# Patient Record
Sex: Female | Born: 1965 | Race: White | Hispanic: No | Smoking: Current every day smoker
Health system: Southern US, Community
[De-identification: ages and names within clinical notes are randomized; demographics above are authoritative.]

## PROBLEM LIST (undated history)

## (undated) DIAGNOSIS — G8929 Other chronic pain: Secondary | ICD-10-CM

## (undated) DIAGNOSIS — W3400XA Accidental discharge from unspecified firearms or gun, initial encounter: Secondary | ICD-10-CM

## (undated) DIAGNOSIS — F4310 Post-traumatic stress disorder, unspecified: Secondary | ICD-10-CM

## (undated) DIAGNOSIS — M5430 Sciatica, unspecified side: Secondary | ICD-10-CM

## (undated) DIAGNOSIS — Y249XXA Unspecified firearm discharge, undetermined intent, initial encounter: Secondary | ICD-10-CM

## (undated) DIAGNOSIS — F329 Major depressive disorder, single episode, unspecified: Secondary | ICD-10-CM

## (undated) DIAGNOSIS — F419 Anxiety disorder, unspecified: Secondary | ICD-10-CM

## (undated) DIAGNOSIS — E039 Hypothyroidism, unspecified: Secondary | ICD-10-CM

## (undated) DIAGNOSIS — F32A Depression, unspecified: Secondary | ICD-10-CM

## (undated) DIAGNOSIS — M549 Dorsalgia, unspecified: Secondary | ICD-10-CM

## (undated) HISTORY — DX: Hypothyroidism, unspecified: E03.9

## (undated) HISTORY — PX: EYE SURGERY: SHX253

## (undated) HISTORY — PX: ABDOMINAL HYSTERECTOMY: SHX81

---

## 2001-03-26 ENCOUNTER — Ambulatory Visit (HOSPITAL_COMMUNITY): Admission: RE | Admit: 2001-03-26 | Discharge: 2001-03-26 | Payer: Self-pay | Admitting: Internal Medicine

## 2001-06-29 ENCOUNTER — Ambulatory Visit (HOSPITAL_COMMUNITY): Admission: RE | Admit: 2001-06-29 | Discharge: 2001-06-29 | Payer: Self-pay | Admitting: Internal Medicine

## 2002-09-09 ENCOUNTER — Ambulatory Visit (HOSPITAL_COMMUNITY): Admission: RE | Admit: 2002-09-09 | Discharge: 2002-09-09 | Payer: Self-pay | Admitting: Internal Medicine

## 2002-09-09 ENCOUNTER — Encounter: Payer: Self-pay | Admitting: Internal Medicine

## 2003-08-08 ENCOUNTER — Encounter: Payer: Self-pay | Admitting: Family Medicine

## 2003-08-08 ENCOUNTER — Ambulatory Visit (HOSPITAL_COMMUNITY): Admission: RE | Admit: 2003-08-08 | Discharge: 2003-08-08 | Payer: Self-pay | Admitting: Family Medicine

## 2004-07-02 ENCOUNTER — Emergency Department (HOSPITAL_COMMUNITY): Admission: EM | Admit: 2004-07-02 | Discharge: 2004-07-02 | Payer: Self-pay | Admitting: *Deleted

## 2004-07-03 ENCOUNTER — Emergency Department (HOSPITAL_COMMUNITY): Admission: EM | Admit: 2004-07-03 | Discharge: 2004-07-03 | Payer: Self-pay | Admitting: *Deleted

## 2004-07-10 ENCOUNTER — Ambulatory Visit (HOSPITAL_COMMUNITY): Admission: RE | Admit: 2004-07-10 | Discharge: 2004-07-10 | Payer: Self-pay | Admitting: *Deleted

## 2004-07-12 ENCOUNTER — Ambulatory Visit (HOSPITAL_COMMUNITY): Admission: RE | Admit: 2004-07-12 | Discharge: 2004-07-12 | Payer: Self-pay | Admitting: Internal Medicine

## 2004-08-25 ENCOUNTER — Emergency Department (HOSPITAL_COMMUNITY): Admission: EM | Admit: 2004-08-25 | Discharge: 2004-08-25 | Payer: Self-pay

## 2005-07-12 ENCOUNTER — Emergency Department (HOSPITAL_COMMUNITY): Admission: EM | Admit: 2005-07-12 | Discharge: 2005-07-12 | Payer: Self-pay | Admitting: Emergency Medicine

## 2005-07-14 ENCOUNTER — Emergency Department (HOSPITAL_COMMUNITY): Admission: EM | Admit: 2005-07-14 | Discharge: 2005-07-14 | Payer: Self-pay | Admitting: Emergency Medicine

## 2005-07-15 ENCOUNTER — Emergency Department (HOSPITAL_COMMUNITY): Admission: EM | Admit: 2005-07-15 | Discharge: 2005-07-15 | Payer: Self-pay | Admitting: Emergency Medicine

## 2005-08-20 ENCOUNTER — Ambulatory Visit (HOSPITAL_COMMUNITY): Admission: RE | Admit: 2005-08-20 | Discharge: 2005-08-20 | Payer: Self-pay | Admitting: Neurological Surgery

## 2005-08-22 ENCOUNTER — Encounter: Admission: RE | Admit: 2005-08-22 | Discharge: 2005-08-22 | Payer: Self-pay | Admitting: Neurological Surgery

## 2005-09-13 ENCOUNTER — Ambulatory Visit: Payer: Self-pay | Admitting: Family Medicine

## 2005-11-04 ENCOUNTER — Ambulatory Visit: Payer: Self-pay | Admitting: Family Medicine

## 2006-01-16 ENCOUNTER — Ambulatory Visit: Payer: Self-pay | Admitting: Family Medicine

## 2006-12-30 ENCOUNTER — Emergency Department (HOSPITAL_COMMUNITY): Admission: EM | Admit: 2006-12-30 | Discharge: 2006-12-30 | Payer: Self-pay | Admitting: Emergency Medicine

## 2007-04-13 ENCOUNTER — Emergency Department (HOSPITAL_COMMUNITY): Admission: EM | Admit: 2007-04-13 | Discharge: 2007-04-13 | Payer: Self-pay | Admitting: Emergency Medicine

## 2007-04-14 ENCOUNTER — Ambulatory Visit: Payer: Self-pay | Admitting: Family Medicine

## 2010-01-23 ENCOUNTER — Emergency Department (HOSPITAL_COMMUNITY): Admission: EM | Admit: 2010-01-23 | Discharge: 2010-01-23 | Payer: Self-pay | Admitting: Emergency Medicine

## 2010-03-06 ENCOUNTER — Emergency Department (HOSPITAL_COMMUNITY): Admission: EM | Admit: 2010-03-06 | Discharge: 2010-03-06 | Payer: Self-pay | Admitting: Emergency Medicine

## 2010-03-30 ENCOUNTER — Emergency Department (HOSPITAL_COMMUNITY): Admission: EM | Admit: 2010-03-30 | Discharge: 2010-03-30 | Payer: Self-pay | Admitting: Emergency Medicine

## 2010-07-23 ENCOUNTER — Emergency Department (HOSPITAL_COMMUNITY): Admission: EM | Admit: 2010-07-23 | Discharge: 2010-07-23 | Payer: Self-pay | Admitting: Emergency Medicine

## 2010-11-19 ENCOUNTER — Emergency Department (HOSPITAL_COMMUNITY)
Admission: EM | Admit: 2010-11-19 | Discharge: 2010-11-19 | Payer: Self-pay | Source: Home / Self Care | Admitting: Emergency Medicine

## 2010-12-30 ENCOUNTER — Encounter: Payer: Self-pay | Admitting: Family Medicine

## 2011-11-10 ENCOUNTER — Emergency Department (HOSPITAL_COMMUNITY)
Admission: EM | Admit: 2011-11-10 | Discharge: 2011-11-10 | Disposition: A | Discharge status: Home / Self Care | Payer: Medicare PPO

## 2011-11-10 NOTE — ED Notes (Signed)
No answer x3 for triage.  

## 2012-01-29 ENCOUNTER — Encounter (HOSPITAL_COMMUNITY): Payer: Self-pay | Admitting: *Deleted

## 2012-01-29 ENCOUNTER — Emergency Department (HOSPITAL_COMMUNITY)
Admission: EM | Admit: 2012-01-29 | Discharge: 2012-01-29 | Disposition: A | Discharge status: Home / Self Care | Payer: Medicare PPO | Attending: Emergency Medicine | Admitting: Emergency Medicine

## 2012-01-29 ENCOUNTER — Emergency Department (HOSPITAL_COMMUNITY): Payer: Medicare PPO

## 2012-01-29 DIAGNOSIS — S20219A Contusion of unspecified front wall of thorax, initial encounter: Secondary | ICD-10-CM | POA: Insufficient documentation

## 2012-01-29 DIAGNOSIS — M542 Cervicalgia: Secondary | ICD-10-CM | POA: Insufficient documentation

## 2012-01-29 DIAGNOSIS — F411 Generalized anxiety disorder: Secondary | ICD-10-CM | POA: Insufficient documentation

## 2012-01-29 DIAGNOSIS — S2239XA Fracture of one rib, unspecified side, initial encounter for closed fracture: Secondary | ICD-10-CM | POA: Insufficient documentation

## 2012-01-29 DIAGNOSIS — S2232XA Fracture of one rib, left side, initial encounter for closed fracture: Secondary | ICD-10-CM

## 2012-01-29 DIAGNOSIS — M25519 Pain in unspecified shoulder: Secondary | ICD-10-CM | POA: Insufficient documentation

## 2012-01-29 DIAGNOSIS — R079 Chest pain, unspecified: Secondary | ICD-10-CM | POA: Insufficient documentation

## 2012-01-29 DIAGNOSIS — W11XXXA Fall on and from ladder, initial encounter: Secondary | ICD-10-CM | POA: Insufficient documentation

## 2012-01-29 HISTORY — DX: Anxiety disorder, unspecified: F41.9

## 2012-01-29 MED ORDER — METHOCARBAMOL 500 MG PO TABS: 500.0000 mg | ORAL_TABLET | Freq: Two times a day (BID) | ORAL | Status: DC

## 2012-01-29 MED ORDER — ONDANSETRON HCL 4 MG PO TABS
4.0000 mg | ORAL_TABLET | Freq: Once | ORAL | Status: AC
  Administered 2012-01-29: 4 mg via ORAL
  Filled 2012-01-29: qty 1

## 2012-01-29 MED ORDER — METHOCARBAMOL 500 MG PO TABS: ORAL_TABLET | ORAL | Status: DC

## 2012-01-29 MED ORDER — OXYCODONE-ACETAMINOPHEN 5-500 MG PO CAPS: 1.0000 | ORAL_CAPSULE | Freq: Four times a day (QID) | ORAL | Status: AC | PRN

## 2012-01-29 MED ORDER — HYDROCODONE-ACETAMINOPHEN 5-325 MG PO TABS
2.0000 | ORAL_TABLET | Freq: Once | ORAL | Status: AC
  Administered 2012-01-29: 2 via ORAL
  Filled 2012-01-29: qty 2

## 2012-01-29 MED ORDER — METHOCARBAMOL 500 MG PO TABS
1000.0000 mg | ORAL_TABLET | Freq: Once | ORAL | Status: AC
  Administered 2012-01-29: 1000 mg via ORAL
  Filled 2012-01-29: qty 2

## 2012-01-29 NOTE — ED Notes (Signed)
Right rib pain and right arm pain after falling on a ladder 2 days ago.

## 2012-01-29 NOTE — ED Provider Notes (Signed)
History     CSN: [id number]  Arrival date & time 01/29/12  1423   First MD Initiated Contact with Patient 01/29/12 1530      Chief Complaint  Patient presents with  . Fall    (Consider location/radiation/quality/duration/timing/severity/associated sxs/prior treatment) HPI Comments: 2 days ago the patient was on a 5 foot ladder, when she fell and injured the left ribs left shoulder and neck. She tried conservative management at home but this did not improve the pain. She  returns she presents for evaluation of her rib pain shoulder pain and neck pain. There's been no loss of consciousness. There's been no cough or shortness of breath.   Patient is a 46 y.o. female presenting with fall. The history is provided by the patient.  Fall Pertinent negatives include no abdominal pain and no hematuria.    Past Medical History  Diagnosis Date  . Anxiety     Past Surgical History  Procedure Date  . Abdominal hysterectomy     No family history on file.  History  Substance Use Topics  . Smoking status: Current Everyday Smoker  . Smokeless tobacco: Not on file  . Alcohol Use: No    OB History    Grav Para Term Preterm Abortions TAB SAB Ect Mult Living                  Review of Systems  Constitutional: Negative for activity change.       All ROS Neg except as noted in HPI  HENT: Negative for nosebleeds and neck pain.   Eyes: Negative for photophobia and discharge.  Respiratory: Negative for cough, shortness of breath and wheezing.   Cardiovascular: Negative for chest pain and palpitations.  Gastrointestinal: Negative for abdominal pain and blood in stool.  Genitourinary: Negative for dysuria, frequency and hematuria.  Musculoskeletal: Negative for back pain and arthralgias.  Skin: Negative.   Neurological: Negative for dizziness, seizures and speech difficulty.  Psychiatric/Behavioral: Negative for hallucinations and confusion. The patient is nervous/anxious.      Allergies  Sulfa antibiotics  Home Medications   Current Outpatient Rx  Name Route Sig Dispense Refill  . CLONAZEPAM 1 MG PO TABS Oral Take 1 mg by mouth 2 (two) times daily as needed. For anxiety    . IBUPROFEN 200 MG PO TABS Oral Take 800 mg by mouth as needed. For pain    . LISDEXAMFETAMINE DIMESYLATE 30 MG PO CAPS Oral Take 30 mg by mouth every morning.    Marland Kitchen METHOCARBAMOL 500 MG PO TABS  2 po tid for spasm 30 tablet 0  . OXYCODONE-ACETAMINOPHEN 5-500 MG PO CAPS Oral Take 1 capsule by mouth every 6 (six) hours as needed for pain. 24 capsule 0    BP 111/78  Pulse 91  Temp(Src) 98.4 F (36.9 C) (Oral)  Resp 16  Ht 5\' 5"  (1.651 m)  Wt 134 lb (60.782 kg)  BMI 22.30 kg/m2  SpO2 97%  Physical Exam  Nursing note and vitals reviewed. Constitutional: She is oriented to person, place, and time. She appears well-developed and well-nourished.  Non-toxic appearance.  HENT:  Head: Normocephalic.  Right Ear: Tympanic membrane and external ear normal.  Left Ear: Tympanic membrane and external ear normal.       Patient speaks in complete sentences without problem.  Eyes: EOM and lids are normal. Pupils are equal, round, and reactive to light.  Neck: Normal range of motion. Neck supple. Carotid bruit is not present.  Soreness to palpation of the left neck extending into the left shoulders. No palpable deformity or step-off.  Cardiovascular: Normal rate, regular rhythm, normal heart sounds, intact distal pulses and normal pulses.   Pulmonary/Chest: Breath sounds normal. No respiratory distress.       There is bruising and pain to the left chest wall. No crepitus appreciated. Lungs are clear. There symmetrical rise and fall of the chest.  Abdominal: Soft. Bowel sounds are normal. There is no tenderness. There is no guarding.  Musculoskeletal:       There is soreness with range of motion of the left shoulder. There is no deformity. Full range of motion of the left elbow wrist and  fingers. Good capillary refill.  Lymphadenopathy:       Head (right side): No submandibular adenopathy present.       Head (left side): No submandibular adenopathy present.    She has no cervical adenopathy.  Neurological: She is alert and oriented to person, place, and time. She has normal strength. No cranial nerve deficit or sensory deficit. She exhibits normal muscle tone. Coordination normal.  Skin: Skin is warm and dry.  Psychiatric: She has a normal mood and affect. Her speech is normal.    ED Course  Procedures (including critical care time) Pulse oximetry 97% on room air. Within normal limits by my interpretation. Labs Reviewed - No data to display Dg Ribs Unilateral W/chest Left  01/29/2012  *RADIOLOGY REPORT*  Clinical Data: Fall.  Left posterior pain.  LEFT RIBS AND CHEST - 3+ VIEW  Comparison: None.  Findings: Heart size is normal.  Mediastinal shadows are normal. The lungs are clear.  No pneumothorax or hemothorax.  Rib detail films show a nondisplaced fracture of the left eighth rib laterally.  IMPRESSION: Nondisplaced fracture of the left eighth rib laterally.  Original Report Authenticated By: Thomasenia Sales, M.D.   Dg Cervical Spine Complete  01/29/2012  *RADIOLOGY REPORT*  Clinical Data: Fall.  Pain.  CERVICAL SPINE - COMPLETE 4+ VIEW  Comparison: None.  Findings: Alignment is normal.  No soft tissue swelling.  No fracture. There is mild spondylosis at C5-6 with disc space narrowing and small marginal osteophytes.  IMPRESSION: No acute or traumatic finding.  Mild spondylosis C5-6.  Original Report Authenticated By: Thomasenia Sales, M.D.     1. Left rib fracture       MDM  I have reviewed nursing notes, vital signs, and all appropriate lab and imaging results for this patient. Test results reviewed with the patient. Patient advised to splint her left side was up (deep breathing several times per hour. Prescription for Robaxin 500 mg and Tylox  Her a5 mg given to the  patient. Patient see her primary physician for recheck and evaluation in the office.       Kathie Dike, [place] 01/29/12 [phone]

## 2012-01-29 NOTE — ED Provider Notes (Signed)
Medical screening examination/treatment/procedure(s) were performed by non-physician practitioner and as supervising physician I was immediately available for consultation/collaboration.   Benny Lennert, MD 01/29/12 2237

## 2012-01-29 NOTE — ED Notes (Signed)
Pt states fell off a ladder from ceiling level while painting her kitchen on Monday.  Pt has increasing LEFT sided rib pain and left shoulder pain.  Pt reports taking motrin ( 800 mg) without relief.  Pt reports having increased pain with deep breathes. Denies LOC.

## 2012-01-29 NOTE — Discharge Instructions (Signed)
Fracture of the eighth rib on the left side. Please use the Tylox and Robaxin as ordered please. Please practice cough and deep breathing several times each hour. Please see her primary physician for followup and evaluation in the office.

## 2013-09-01 ENCOUNTER — Emergency Department (HOSPITAL_COMMUNITY)
Admission: EM | Admit: 2013-09-01 | Discharge: 2013-09-01 | Disposition: A | Discharge status: Home / Self Care | Payer: Medicare PPO | Attending: Emergency Medicine | Admitting: Emergency Medicine

## 2013-09-01 ENCOUNTER — Encounter (HOSPITAL_COMMUNITY): Payer: Self-pay | Admitting: Emergency Medicine

## 2013-09-01 DIAGNOSIS — Z79899 Other long term (current) drug therapy: Secondary | ICD-10-CM | POA: Insufficient documentation

## 2013-09-01 DIAGNOSIS — G8929 Other chronic pain: Secondary | ICD-10-CM | POA: Insufficient documentation

## 2013-09-01 DIAGNOSIS — M545 Low back pain, unspecified: Secondary | ICD-10-CM | POA: Insufficient documentation

## 2013-09-01 DIAGNOSIS — Z8659 Personal history of other mental and behavioral disorders: Secondary | ICD-10-CM | POA: Insufficient documentation

## 2013-09-01 DIAGNOSIS — M549 Dorsalgia, unspecified: Secondary | ICD-10-CM

## 2013-09-01 DIAGNOSIS — F17200 Nicotine dependence, unspecified, uncomplicated: Secondary | ICD-10-CM | POA: Insufficient documentation

## 2013-09-01 DIAGNOSIS — M542 Cervicalgia: Secondary | ICD-10-CM | POA: Insufficient documentation

## 2013-09-01 HISTORY — DX: Major depressive disorder, single episode, unspecified: F32.9

## 2013-09-01 HISTORY — DX: Dorsalgia, unspecified: M54.9

## 2013-09-01 HISTORY — DX: Depression, unspecified: F32.A

## 2013-09-01 HISTORY — DX: Unspecified firearm discharge, undetermined intent, initial encounter: Y24.9XXA

## 2013-09-01 HISTORY — DX: Accidental discharge from unspecified firearms or gun, initial encounter: W34.00XA

## 2013-09-01 HISTORY — DX: Other chronic pain: G89.29

## 2013-09-01 MED ORDER — CYCLOBENZAPRINE HCL 10 MG PO TABS: 10.0000 mg | ORAL_TABLET | Freq: Two times a day (BID) | ORAL | Status: DC | PRN

## 2013-09-01 MED ORDER — OXYCODONE HCL 15 MG PO TABS: 15.0000 mg | ORAL_TABLET | Freq: Four times a day (QID) | ORAL | Status: DC | PRN
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
## 2013-09-01 NOTE — ED Notes (Addendum)
Patient c/o chronic lower back pain that radiates into left leg and neck x2 days. Per patient has chronic back pain in which she goes to a pain clinic. Patient denies any new injury. Patient reports taking Oxycontin and has a fentanyl patch with no relief.

## 2013-10-31 ENCOUNTER — Emergency Department (HOSPITAL_COMMUNITY)
Admission: EM | Admit: 2013-10-31 | Discharge: 2013-10-31 | Disposition: A | Discharge status: Home / Self Care | Payer: Medicare PPO | Attending: Emergency Medicine | Admitting: Emergency Medicine

## 2013-10-31 ENCOUNTER — Encounter (HOSPITAL_COMMUNITY): Payer: Self-pay | Admitting: Emergency Medicine

## 2013-10-31 DIAGNOSIS — F410 Panic disorder [episodic paroxysmal anxiety] without agoraphobia: Secondary | ICD-10-CM | POA: Insufficient documentation

## 2013-10-31 DIAGNOSIS — Z87828 Personal history of other (healed) physical injury and trauma: Secondary | ICD-10-CM | POA: Insufficient documentation

## 2013-10-31 DIAGNOSIS — G4700 Insomnia, unspecified: Secondary | ICD-10-CM | POA: Insufficient documentation

## 2013-10-31 DIAGNOSIS — R079 Chest pain, unspecified: Secondary | ICD-10-CM | POA: Insufficient documentation

## 2013-10-31 DIAGNOSIS — G8929 Other chronic pain: Secondary | ICD-10-CM | POA: Insufficient documentation

## 2013-10-31 DIAGNOSIS — F17200 Nicotine dependence, unspecified, uncomplicated: Secondary | ICD-10-CM | POA: Insufficient documentation

## 2013-10-31 DIAGNOSIS — R0602 Shortness of breath: Secondary | ICD-10-CM | POA: Insufficient documentation

## 2013-10-31 MED ORDER — LORAZEPAM 1 MG PO TABS
1.0000 mg | ORAL_TABLET | Freq: Once | ORAL | Status: AC
  Administered 2013-10-31: 1 mg via ORAL
  Filled 2013-10-31: qty 1

## 2013-10-31 MED ORDER — LORAZEPAM 1 MG PO TABS: 1.0000 mg | ORAL_TABLET | Freq: Three times a day (TID) | ORAL | Status: DC | PRN

## 2013-10-31 NOTE — ED Notes (Signed)
Pt c/o panic attack, chest tightness. Dx with anxiety disorder, PTSD.
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
## 2014-10-10 ENCOUNTER — Encounter (HOSPITAL_COMMUNITY): Payer: Self-pay | Admitting: Emergency Medicine

## 2015-02-22 ENCOUNTER — Encounter (HOSPITAL_COMMUNITY): Payer: Self-pay | Admitting: *Deleted

## 2015-02-22 ENCOUNTER — Emergency Department (HOSPITAL_COMMUNITY)
Admission: EM | Admit: 2015-02-22 | Discharge: 2015-02-22 | Disposition: A | Discharge status: Home / Self Care | Payer: Medicare PPO | Attending: Emergency Medicine | Admitting: Emergency Medicine

## 2015-02-22 ENCOUNTER — Emergency Department (HOSPITAL_COMMUNITY): Payer: Medicare PPO

## 2015-02-22 DIAGNOSIS — G8929 Other chronic pain: Secondary | ICD-10-CM | POA: Diagnosis not present

## 2015-02-22 DIAGNOSIS — Z87828 Personal history of other (healed) physical injury and trauma: Secondary | ICD-10-CM | POA: Diagnosis not present

## 2015-02-22 DIAGNOSIS — K029 Dental caries, unspecified: Secondary | ICD-10-CM | POA: Diagnosis not present

## 2015-02-22 DIAGNOSIS — S0992XA Unspecified injury of nose, initial encounter: Secondary | ICD-10-CM | POA: Insufficient documentation

## 2015-02-22 DIAGNOSIS — Y9289 Other specified places as the place of occurrence of the external cause: Secondary | ICD-10-CM | POA: Insufficient documentation

## 2015-02-22 DIAGNOSIS — S0083XA Contusion of other part of head, initial encounter: Secondary | ICD-10-CM

## 2015-02-22 DIAGNOSIS — Y998 Other external cause status: Secondary | ICD-10-CM | POA: Diagnosis not present

## 2015-02-22 DIAGNOSIS — Y9389 Activity, other specified: Secondary | ICD-10-CM | POA: Diagnosis not present

## 2015-02-22 DIAGNOSIS — Z720 Tobacco use: Secondary | ICD-10-CM | POA: Diagnosis not present

## 2015-02-22 DIAGNOSIS — Z79899 Other long term (current) drug therapy: Secondary | ICD-10-CM | POA: Diagnosis not present

## 2015-02-22 DIAGNOSIS — Z7982 Long term (current) use of aspirin: Secondary | ICD-10-CM | POA: Diagnosis not present

## 2015-02-22 DIAGNOSIS — F329 Major depressive disorder, single episode, unspecified: Secondary | ICD-10-CM | POA: Insufficient documentation

## 2015-02-22 DIAGNOSIS — Z792 Long term (current) use of antibiotics: Secondary | ICD-10-CM | POA: Insufficient documentation

## 2015-02-22 DIAGNOSIS — F419 Anxiety disorder, unspecified: Secondary | ICD-10-CM | POA: Insufficient documentation

## 2015-02-22 DIAGNOSIS — S0990XA Unspecified injury of head, initial encounter: Secondary | ICD-10-CM | POA: Diagnosis present
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
## 2015-02-22 NOTE — ED Notes (Signed)
Discharge instructions given, pt demonstrated teach back and verbal understanding. No concerns voiced.  

## 2015-02-22 NOTE — ED Notes (Signed)
Patient has had a hysterectomy, no POC pregnancy test needed.
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
## 2017-02-18 ENCOUNTER — Ambulatory Visit: Payer: Self-pay | Admitting: Physician Assistant

## 2017-03-05 ENCOUNTER — Ambulatory Visit: Payer: Self-pay | Admitting: Physician Assistant

## 2017-03-24 ENCOUNTER — Ambulatory Visit: Payer: Self-pay | Admitting: Physician Assistant

## 2017-04-02 ENCOUNTER — Ambulatory Visit: Payer: Self-pay | Admitting: Physician Assistant

## 2017-04-03 ENCOUNTER — Encounter: Payer: Self-pay | Admitting: Physician Assistant

## 2017-06-20 ENCOUNTER — Ambulatory Visit: Payer: Self-pay | Admitting: Physician Assistant

## 2017-07-07 ENCOUNTER — Ambulatory Visit: Payer: Self-pay | Admitting: Physician Assistant

## 2017-07-08 ENCOUNTER — Encounter: Payer: Self-pay | Admitting: Physician Assistant

## 2017-07-19 ENCOUNTER — Emergency Department (HOSPITAL_COMMUNITY)
Admission: EM | Admit: 2017-07-19 | Discharge: 2017-07-19 | Disposition: A | Discharge status: Home / Self Care | Payer: Medicare PPO | Attending: Emergency Medicine | Admitting: Emergency Medicine

## 2017-07-19 ENCOUNTER — Encounter (HOSPITAL_COMMUNITY): Payer: Self-pay | Admitting: Emergency Medicine

## 2017-07-19 DIAGNOSIS — Z79899 Other long term (current) drug therapy: Secondary | ICD-10-CM | POA: Insufficient documentation

## 2017-07-19 DIAGNOSIS — L237 Allergic contact dermatitis due to plants, except food: Secondary | ICD-10-CM | POA: Insufficient documentation

## 2017-07-19 DIAGNOSIS — R21 Rash and other nonspecific skin eruption: Secondary | ICD-10-CM | POA: Diagnosis present

## 2017-07-19 HISTORY — DX: Post-traumatic stress disorder, unspecified: F43.10

## 2017-07-19 HISTORY — DX: Sciatica, unspecified side: M54.30

## 2017-07-19 MED ORDER — PREDNISONE 10 MG PO TABS: ORAL_TABLET | ORAL | 0 refills | Status: DC

## 2017-07-19 MED ORDER — PREDNISONE 50 MG PO TABS
60.0000 mg | ORAL_TABLET | Freq: Once | ORAL | Status: AC
  Administered 2017-07-19: 60 mg via ORAL
  Filled 2017-07-19: qty 1

## 2017-07-19 NOTE — ED Notes (Signed)
Poison oak per report x 2 days  Now rash is on face and near eyes

## 2017-07-19 NOTE — ED Triage Notes (Signed)
Pt reports poison oak last several days on legs and reports has spread "to right cheek."nad noted.

## 2017-07-19 NOTE — Discharge Instructions (Signed)
Take your next dose of prednisone tomorrow with your evening meal. You may continue using your calamine lotion and benadryl for itching.  Gold bond anti itch cream/ cold compresses or ice packs and also good ways to help with itch relief.
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
## 2017-08-08 ENCOUNTER — Observation Stay (HOSPITAL_COMMUNITY)
Admission: EM | Admit: 2017-08-08 | Discharge: 2017-08-09 | Disposition: A | Discharge status: Home / Self Care | Payer: Medicare PPO | Attending: Orthopedic Surgery | Admitting: Orthopedic Surgery

## 2017-08-08 ENCOUNTER — Encounter (HOSPITAL_COMMUNITY)
Admission: EM | Disposition: A | Discharge status: Home / Self Care | Payer: Self-pay | Source: Home / Self Care | Attending: Emergency Medicine

## 2017-08-08 ENCOUNTER — Emergency Department (HOSPITAL_COMMUNITY): Payer: Medicare PPO

## 2017-08-08 ENCOUNTER — Emergency Department (HOSPITAL_COMMUNITY): Payer: Medicare PPO | Admitting: Certified Registered"

## 2017-08-08 ENCOUNTER — Encounter (HOSPITAL_COMMUNITY): Payer: Self-pay | Admitting: *Deleted

## 2017-08-08 DIAGNOSIS — S66321A Laceration of extensor muscle, fascia and tendon of left index finger at wrist and hand level, initial encounter: Secondary | ICD-10-CM | POA: Insufficient documentation

## 2017-08-08 DIAGNOSIS — S62613B Displaced fracture of proximal phalanx of left middle finger, initial encounter for open fracture: Principal | ICD-10-CM | POA: Insufficient documentation

## 2017-08-08 DIAGNOSIS — Z8249 Family history of ischemic heart disease and other diseases of the circulatory system: Secondary | ICD-10-CM | POA: Insufficient documentation

## 2017-08-08 DIAGNOSIS — F329 Major depressive disorder, single episode, unspecified: Secondary | ICD-10-CM | POA: Diagnosis not present

## 2017-08-08 DIAGNOSIS — S62393B Other fracture of third metacarpal bone, left hand, initial encounter for open fracture: Secondary | ICD-10-CM | POA: Insufficient documentation

## 2017-08-08 DIAGNOSIS — Z90710 Acquired absence of both cervix and uterus: Secondary | ICD-10-CM | POA: Insufficient documentation

## 2017-08-08 DIAGNOSIS — S6292XB Unspecified fracture of left wrist and hand, initial encounter for open fracture: Secondary | ICD-10-CM

## 2017-08-08 DIAGNOSIS — W312XXA Contact with powered woodworking and forming machines, initial encounter: Secondary | ICD-10-CM | POA: Diagnosis not present

## 2017-08-08 DIAGNOSIS — F17210 Nicotine dependence, cigarettes, uncomplicated: Secondary | ICD-10-CM | POA: Diagnosis not present

## 2017-08-08 DIAGNOSIS — S61412A Laceration without foreign body of left hand, initial encounter: Secondary | ICD-10-CM

## 2017-08-08 DIAGNOSIS — F4310 Post-traumatic stress disorder, unspecified: Secondary | ICD-10-CM | POA: Insufficient documentation

## 2017-08-08 DIAGNOSIS — S66323A Laceration of extensor muscle, fascia and tendon of left middle finger at wrist and hand level, initial encounter: Secondary | ICD-10-CM | POA: Diagnosis present

## 2017-08-08 DIAGNOSIS — Z9889 Other specified postprocedural states: Secondary | ICD-10-CM | POA: Insufficient documentation

## 2017-08-08 DIAGNOSIS — G709 Myoneural disorder, unspecified: Secondary | ICD-10-CM | POA: Diagnosis not present

## 2017-08-08 DIAGNOSIS — F419 Anxiety disorder, unspecified: Secondary | ICD-10-CM | POA: Insufficient documentation

## 2017-08-08 DIAGNOSIS — S61402A Unspecified open wound of left hand, initial encounter: Secondary | ICD-10-CM

## 2017-08-08 DIAGNOSIS — Z823 Family history of stroke: Secondary | ICD-10-CM | POA: Insufficient documentation

## 2017-08-08 DIAGNOSIS — Z79899 Other long term (current) drug therapy: Secondary | ICD-10-CM | POA: Insufficient documentation

## 2017-08-08 DIAGNOSIS — Z882 Allergy status to sulfonamides status: Secondary | ICD-10-CM | POA: Insufficient documentation

## 2017-08-08 DIAGNOSIS — S66902A Unspecified injury of unspecified muscle, fascia and tendon at wrist and hand level, left hand, initial encounter: Secondary | ICD-10-CM

## 2017-08-08 DIAGNOSIS — S62399B Other fracture of unspecified metacarpal bone, initial encounter for open fracture: Secondary | ICD-10-CM | POA: Diagnosis present

## 2017-08-08 HISTORY — PX: I & D EXTREMITY: SHX5045

## 2017-08-08 LAB — BASIC METABOLIC PANEL
ANION GAP: 8 (ref 5–15)
BUN: 10 mg/dL (ref 6–20)
CHLORIDE: 107 mmol/L (ref 101–111)
CO2: 24 mmol/L (ref 22–32)
Calcium: 8.3 mg/dL — ABNORMAL LOW (ref 8.9–10.3)
Creatinine, Ser: 0.68 mg/dL (ref 0.44–1.00)
GFR calc Af Amer: >60 mL/min (ref >60)
GLUCOSE: 104 mg/dL — AB (ref 65–99)
POTASSIUM: 4.1 mmol/L (ref 3.5–5.1)
Sodium: 139 mmol/L (ref 135–145)

## 2017-08-08 LAB — CBC
HCT: 35.7 % — ABNORMAL LOW (ref 36.0–46.0)
Hemoglobin: 11.5 g/dL — ABNORMAL LOW (ref 12.0–15.0)
MCH: 28.2 pg (ref 26.0–34.0)
MCHC: 32.2 g/dL (ref 30.0–36.0)
MCV: 87.5 fL (ref 78.0–100.0)
PLATELETS: 229 10*3/uL (ref 150–400)
RBC: 4.08 MIL/uL (ref 3.87–5.11)
RDW: 13.7 % (ref 11.5–15.5)
WBC: 10.2 10*3/uL (ref 4.0–10.5)

## 2017-08-08 SURGERY — IRRIGATION AND DEBRIDEMENT EXTREMITY
Anesthesia: General | Site: Hand | Laterality: Left

## 2017-08-08 MED ORDER — CEFAZOLIN SODIUM-DEXTROSE 1-4 GM/50ML-% IV SOLN
1.0000 g | Freq: Once | INTRAVENOUS | Status: AC
  Administered 2017-08-08: 1 g via INTRAVENOUS
  Filled 2017-08-08: qty 50

## 2017-08-08 MED ORDER — MIDAZOLAM HCL 2 MG/2ML IJ SOLN
INTRAMUSCULAR | Status: AC
  Filled 2017-08-08: qty 2

## 2017-08-08 MED ORDER — DOCUSATE SODIUM 100 MG PO CAPS
100.0000 mg | ORAL_CAPSULE | Freq: Two times a day (BID) | ORAL | Status: DC
  Administered 2017-08-08 – 2017-08-09 (×2): 100 mg via ORAL
  Filled 2017-08-08 (×2): qty 1

## 2017-08-08 MED ORDER — LIDOCAINE HCL (CARDIAC) 20 MG/ML IV SOLN
INTRAVENOUS | Status: DC | PRN
  Administered 2017-08-08: 100 mg via INTRAVENOUS

## 2017-08-08 MED ORDER — PHENYLEPHRINE HCL 10 MG/ML IJ SOLN
INTRAMUSCULAR | Status: DC | PRN
  Administered 2017-08-08: 80 ug via INTRAVENOUS
  Administered 2017-08-08: 60 ug via INTRAVENOUS
  Administered 2017-08-08: 80 ug via INTRAVENOUS

## 2017-08-08 MED ORDER — FAMOTIDINE 20 MG PO TABS: 20.0000 mg | ORAL_TABLET | Freq: Two times a day (BID) | ORAL | Status: DC | PRN

## 2017-08-08 MED ORDER — CLONAZEPAM 0.5 MG PO TABS
0.5000 mg | ORAL_TABLET | Freq: Every day | ORAL | Status: DC
  Administered 2017-08-09: 0.5 mg via ORAL
  Filled 2017-08-08: qty 1

## 2017-08-08 MED ORDER — ONDANSETRON HCL 4 MG PO TABS: 4.0000 mg | ORAL_TABLET | Freq: Four times a day (QID) | ORAL | Status: DC | PRN

## 2017-08-08 MED ORDER — TETANUS-DIPHTH-ACELL PERTUSSIS 5-2.5-18.5 LF-MCG/0.5 IM SUSP
0.5000 mL | Freq: Once | INTRAMUSCULAR | Status: AC
  Administered 2017-08-08: 0.5 mL via INTRAMUSCULAR
  Filled 2017-08-08: qty 0.5

## 2017-08-08 MED ORDER — MORPHINE SULFATE (PF) 4 MG/ML IV SOLN
1.0000 mg | INTRAVENOUS | Status: DC | PRN
  Administered 2017-08-08: 1 mg via INTRAVENOUS
  Filled 2017-08-08: qty 1

## 2017-08-08 MED ORDER — EPHEDRINE 5 MG/ML INJ
INTRAVENOUS | Status: AC
  Filled 2017-08-08: qty 20

## 2017-08-08 MED ORDER — AMPHETAMINE-DEXTROAMPHETAMINE 10 MG PO TABS
30.0000 mg | ORAL_TABLET | Freq: Every morning | ORAL | Status: DC
Start: 2017-08-09 — End: 2017-08-09
  Administered 2017-08-09: 30 mg via ORAL
  Filled 2017-08-08: qty 3

## 2017-08-08 MED ORDER — PROPOFOL 10 MG/ML IV BOLUS
INTRAVENOUS | Status: DC | PRN
  Administered 2017-08-08: 170 mg via INTRAVENOUS

## 2017-08-08 MED ORDER — PROMETHAZINE HCL 25 MG RE SUPP: 12.5000 mg | Freq: Four times a day (QID) | RECTAL | Status: DC | PRN

## 2017-08-08 MED ORDER — METHOCARBAMOL 1000 MG/10ML IJ SOLN
500.0000 mg | Freq: Four times a day (QID) | INTRAVENOUS | Status: DC | PRN
  Filled 2017-08-08: qty 5

## 2017-08-08 MED ORDER — FENTANYL CITRATE (PF) 100 MCG/2ML IJ SOLN
INTRAMUSCULAR | Status: DC | PRN
  Administered 2017-08-08 (×3): 25 ug via INTRAVENOUS
  Administered 2017-08-08: 50 ug via INTRAVENOUS
  Administered 2017-08-08: 25 ug via INTRAVENOUS
  Administered 2017-08-08: 50 ug via INTRAVENOUS

## 2017-08-08 MED ORDER — DEXAMETHASONE SODIUM PHOSPHATE 10 MG/ML IJ SOLN
INTRAMUSCULAR | Status: DC | PRN
  Administered 2017-08-08: 10 mg via INTRAVENOUS

## 2017-08-08 MED ORDER — MIDAZOLAM HCL 2 MG/2ML IJ SOLN
INTRAMUSCULAR | Status: DC | PRN
  Administered 2017-08-08: 2 mg via INTRAVENOUS

## 2017-08-08 MED ORDER — ASPIRIN 325 MG PO TABS: 325.0000 mg | ORAL_TABLET | Freq: Every day | ORAL | Status: DC | PRN

## 2017-08-08 MED ORDER — 0.9 % SODIUM CHLORIDE (POUR BTL) OPTIME
TOPICAL | Status: DC | PRN
  Administered 2017-08-08: 1000 mL

## 2017-08-08 MED ORDER — HYDROMORPHONE HCL 1 MG/ML IJ SOLN: 0.2500 mg | INTRAMUSCULAR | Status: DC | PRN

## 2017-08-08 MED ORDER — CEFAZOLIN SODIUM-DEXTROSE 1-4 GM/50ML-% IV SOLN
1.0000 g | Freq: Three times a day (TID) | INTRAVENOUS | Status: DC
  Administered 2017-08-09 (×2): 1 g via INTRAVENOUS
  Filled 2017-08-08 (×3): qty 50

## 2017-08-08 MED ORDER — FENTANYL CITRATE (PF) 250 MCG/5ML IJ SOLN
INTRAMUSCULAR | Status: AC
  Filled 2017-08-08: qty 5

## 2017-08-08 MED ORDER — ONDANSETRON HCL 4 MG/2ML IJ SOLN: 4.0000 mg | Freq: Four times a day (QID) | INTRAMUSCULAR | Status: DC | PRN

## 2017-08-08 MED ORDER — SODIUM CHLORIDE 0.9 % IR SOLN
Status: DC | PRN
  Administered 2017-08-08 (×2): 3000 mL

## 2017-08-08 MED ORDER — VITAMIN C 500 MG PO TABS
1000.0000 mg | ORAL_TABLET | Freq: Every day | ORAL | Status: DC
  Administered 2017-08-09: 1000 mg via ORAL
  Filled 2017-08-08: qty 2

## 2017-08-08 MED ORDER — GLYCOPYRROLATE 0.2 MG/ML IJ SOLN
INTRAMUSCULAR | Status: DC | PRN
  Administered 2017-08-08: 0.1 mg via INTRAVENOUS

## 2017-08-08 MED ORDER — CEFAZOLIN SODIUM-DEXTROSE 1-4 GM/50ML-% IV SOLN
1.0000 g | INTRAVENOUS | Status: AC
  Administered 2017-08-08: 1 g via INTRAVENOUS
  Filled 2017-08-08: qty 50

## 2017-08-08 MED ORDER — ONDANSETRON HCL 4 MG/2ML IJ SOLN
INTRAMUSCULAR | Status: DC | PRN
  Administered 2017-08-08: 4 mg via INTRAVENOUS

## 2017-08-08 MED ORDER — EPHEDRINE SULFATE 50 MG/ML IJ SOLN
INTRAMUSCULAR | Status: DC | PRN
  Administered 2017-08-08 (×2): 10 mg via INTRAVENOUS

## 2017-08-08 MED ORDER — DULOXETINE HCL 60 MG PO CPEP
60.0000 mg | ORAL_CAPSULE | Freq: Every day | ORAL | Status: DC
  Administered 2017-08-09: 60 mg via ORAL
  Filled 2017-08-08: qty 1

## 2017-08-08 MED ORDER — ALPRAZOLAM 0.5 MG PO TABS
0.5000 mg | ORAL_TABLET | Freq: Four times a day (QID) | ORAL | Status: DC | PRN
  Administered 2017-08-08: 0.5 mg via ORAL
  Filled 2017-08-08: qty 1

## 2017-08-08 MED ORDER — METHOCARBAMOL 500 MG PO TABS
500.0000 mg | ORAL_TABLET | Freq: Four times a day (QID) | ORAL | Status: DC | PRN
  Administered 2017-08-08: 500 mg via ORAL
  Filled 2017-08-08: qty 1

## 2017-08-08 MED ORDER — OXYCODONE HCL 5 MG PO TABS
5.0000 mg | ORAL_TABLET | ORAL | Status: DC | PRN
  Administered 2017-08-09: 10 mg via ORAL
  Filled 2017-08-08: qty 2

## 2017-08-08 MED ORDER — LACTATED RINGERS IV SOLN
Freq: Once | INTRAVENOUS | Status: AC
  Administered 2017-08-08 (×2): via INTRAVENOUS

## 2017-08-08 MED ORDER — LACTATED RINGERS IV SOLN
INTRAVENOUS | Status: DC
  Administered 2017-08-09: 04:00:00 via INTRAVENOUS

## 2017-08-08 MED ORDER — CEFAZOLIN SODIUM-DEXTROSE 1-4 GM/50ML-% IV SOLN
INTRAVENOUS | Status: DC | PRN
  Administered 2017-08-08: 1 g via INTRAVENOUS

## 2017-08-08 MED ORDER — PHENYLEPHRINE HCL 10 MG/ML IJ SOLN
INTRAMUSCULAR | Status: DC | PRN
  Administered 2017-08-08: 20 ug/min via INTRAVENOUS

## 2017-08-08 SURGICAL SUPPLY — 66 items
BANDAGE ACE 3X5.8 VEL STRL LF (GAUZE/BANDAGES/DRESSINGS) ×3 IMPLANT
BANDAGE ACE 4X5 VEL STRL LF (GAUZE/BANDAGES/DRESSINGS) ×1 IMPLANT
BIT DRILL 2X3.5 HAND QK RELEAS (BIT) IMPLANT
BIT DRILL 2X3.5 QUICK RELEASE (BIT) ×2
BNDG CONFORM 2 STRL LF (GAUZE/BANDAGES/DRESSINGS) IMPLANT
BNDG GAUZE ELAST 4 BULKY (GAUZE/BANDAGES/DRESSINGS) ×5 IMPLANT
CORDS BIPOLAR (ELECTRODE) ×2 IMPLANT
CUFF TOURNIQUET SINGLE 18IN (TOURNIQUET CUFF) ×2 IMPLANT
CUFF TOURNIQUET SINGLE 24IN (TOURNIQUET CUFF) IMPLANT
DRAPE OEC MINIVIEW 54X84 (DRAPES) ×1 IMPLANT
DRAPE SURG 17X11 SM STRL (DRAPES) ×1 IMPLANT
DRSG ADAPTIC 3X8 NADH LF (GAUZE/BANDAGES/DRESSINGS) ×2 IMPLANT
GAUZE SPONGE 4X4 12PLY STRL (GAUZE/BANDAGES/DRESSINGS) ×3 IMPLANT
GAUZE XEROFORM 1X8 LF (GAUZE/BANDAGES/DRESSINGS) ×1 IMPLANT
GAUZE XEROFORM 5X9 LF (GAUZE/BANDAGES/DRESSINGS) ×1 IMPLANT
GLOVE BIOGEL M 8.0 STRL (GLOVE) ×1 IMPLANT
GLOVE SS BIOGEL STRL SZ 8 (GLOVE) ×1 IMPLANT
GLOVE SUPERSENSE BIOGEL SZ 8 (GLOVE) ×2
GOWN STRL REUS W/ TWL LRG LVL3 (GOWN DISPOSABLE) ×1 IMPLANT
GOWN STRL REUS W/ TWL XL LVL3 (GOWN DISPOSABLE) ×2 IMPLANT
GOWN STRL REUS W/TWL LRG LVL3 (GOWN DISPOSABLE) ×6
GOWN STRL REUS W/TWL XL LVL3 (GOWN DISPOSABLE) ×4
HANDPIECE INTERPULSE COAX TIP (DISPOSABLE)
K-WIRE 1.1 (WIRE) ×4
K-WIRE FX150X1.1XTROC TIP (WIRE) ×2
KIT BASIN OR (CUSTOM PROCEDURE TRAY) ×2 IMPLANT
KIT ROOM TURNOVER OR (KITS) ×2 IMPLANT
KWIRE FX150X1.1XTROC TIP (WIRE) IMPLANT
MANIFOLD NEPTUNE II (INSTRUMENTS) ×2 IMPLANT
NDL HYPO 25GX1X1/2 BEV (NEEDLE) IMPLANT
NEEDLE HYPO 25GX1X1/2 BEV (NEEDLE) ×2 IMPLANT
NS IRRIG 1000ML POUR BTL (IV SOLUTION) ×2 IMPLANT
PACK ORTHO EXTREMITY (CUSTOM PROCEDURE TRAY) ×2 IMPLANT
PAD ARMBOARD 7.5X6 YLW CONV (MISCELLANEOUS) ×2 IMPLANT
PAD CAST 3X4 CTTN HI CHSV (CAST SUPPLIES) IMPLANT
PAD CAST 4YDX4 CTTN HI CHSV (CAST SUPPLIES) ×1 IMPLANT
PADDING CAST COTTON 3X4 STRL (CAST SUPPLIES) ×2
PADDING CAST COTTON 4X4 STRL (CAST SUPPLIES)
PLATE STRAIGHT 10HOLE 1.3MM (Plate) ×1 IMPLANT
PUTTY DBM STAGRAFT PLUS 10CC (Putty) ×1 IMPLANT
SCREW BONE LAG 2.3X12MM HEXA (Screw) IMPLANT
SCREW BONE LAG 2.3X8MM HEXA (Screw) IMPLANT
SCREW BONE LAG 2.3X9MM HEXAQ (Screw) IMPLANT
SCREW LAG 2.3X12MM (Screw) ×4 IMPLANT
SCREW LAG 2.3X7MM (Screw) ×1 IMPLANT
SCREW LAG 2.3X8MM (Screw) ×2 IMPLANT
SCREW LAG 2.3X9MM (Screw) ×4 IMPLANT
SCREW NONLOCK TI 2.3X11 (Screw) ×2 IMPLANT
SCRUB BETADINE 4OZ XXX (MISCELLANEOUS) ×2 IMPLANT
SET HNDPC FAN SPRY TIP SCT (DISPOSABLE) IMPLANT
SOL PREP POV-IOD 4OZ 10% (MISCELLANEOUS) ×2 IMPLANT
SPLINT FIBERGLASS 3X35 (CAST SUPPLIES) ×1 IMPLANT
SPONGE LAP 4X18 X RAY DECT (DISPOSABLE) ×1 IMPLANT
SUT CHROMIC 5 0 RB 1 27 (SUTURE) ×2 IMPLANT
SUT FIBER WIRE 4.0 (SUTURE) ×1 IMPLANT
SUT FIBERWIRE 3-0 18 TAPR NDL (SUTURE) ×2
SUT PROLENE 4 0 PS 2 18 (SUTURE) ×3 IMPLANT
SUTURE FIBERWR 3-0 18 TAPR NDL (SUTURE) IMPLANT
SWAB CULTURE ESWAB REG 1ML (MISCELLANEOUS) IMPLANT
SYR CONTROL 10ML LL (SYRINGE) ×1 IMPLANT
TOWEL OR 17X24 6PK STRL BLUE (TOWEL DISPOSABLE) ×2 IMPLANT
TOWEL OR 17X26 10 PK STRL BLUE (TOWEL DISPOSABLE) ×2 IMPLANT
TUBE CONNECTING 12X1/4 (SUCTIONS) ×2 IMPLANT
TUBING CYSTO DISP (UROLOGICAL SUPPLIES) ×1 IMPLANT
WATER STERILE IRR 1000ML POUR (IV SOLUTION) ×1 IMPLANT
YANKAUER SUCT BULB TIP NO VENT (SUCTIONS) ×1 IMPLANT
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
## 2017-08-08 NOTE — Transfer of Care (Signed)
Immediate Anesthesia Transfer of Care Note  Patient: Loretta [name]  Procedure(s) Performed: Procedure(s): IRRIGATION AND DEBRIDEMENT LEFT HAND, REPAIR OF HAND LACERATION, ORIF PROXIMAL PHALANX METACARPAL HEAD MIDDLE FINGER, EXTENSOR TENDON REPAIR MIDDLE AND INDEX FINGER   (Left)  Patient Location: PACU  Anesthesia Type:General  Level of Consciousness: awake and alert   Airway & Oxygen Therapy: Patient Spontanous Breathing and Patient connected to nasal cannula oxygen  Post-op Assessment: Report given to RN, Post -op Vital signs reviewed and stable and Patient moving all extremities X 4  Post vital signs: Reviewed and stable  Last Vitals:  Vitals:   08/08/17 1700 08/08/17 1730  BP: (!) 123/111 (!) 128/92  Pulse: 85 81  Resp:    Temp:    SpO2: 98% 99%    Last Pain:  Vitals:   08/08/17 1539  TempSrc: Oral  PainSc:          Complications: No apparent anesthesia complications

## 2017-08-08 NOTE — Op Note (Signed)
See dictation#[id number] SP reconstr table saw injury Filmore Molyneux MD

## 2017-08-08 NOTE — ED Notes (Signed)
Or permit signed

## 2017-08-08 NOTE — ED Notes (Signed)
Pt in xray

## 2017-08-08 NOTE — ED Notes (Signed)
Dr Amanda Peagramig  Here to see the pt

## 2017-08-08 NOTE — ED Triage Notes (Signed)
The pt arrived by [facility] from [facility]  Se was working with a table saw first time and lacerated her  Lt hand  She is unable to move her lt ring finger  Bleeding controlled with a bandage.  lmp  None iv per ems

## 2017-08-08 NOTE — Anesthesia Procedure Notes (Signed)
Procedure Name: LMA Insertion Date/Time: 08/08/2017 6:28 PM Performed by: Little IshikawaMERCER, Walda Hertzog L Pre-anesthesia Checklist: Patient identified, Emergency Drugs available, Suction available and Patient being monitored Patient Re-evaluated:Patient Re-evaluated prior to induction Oxygen Delivery Method: Circle System Utilized Preoxygenation: Pre-oxygenation with 100% oxygen Induction Type: IV induction Ventilation: Mask ventilation without difficulty LMA: LMA inserted LMA Size: 4.0 Number of attempts: 1 Airway Equipment and Method: Bite block Placement Confirmation: positive ETCO2 Tube secured with: Tape Dental Injury: Teeth and Oropharynx as per pre-operative assessment

## 2017-08-08 NOTE — Anesthesia Preprocedure Evaluation (Addendum)
Anesthesia Evaluation  Patient identified by MRN, date of birth, ID band Patient awake    Reviewed: Allergy & Precautions, NPO status , Patient's Chart, lab work & pertinent test results  Airway Mallampati: II  TM Distance: >3 FB     Dental   Pulmonary Current Smoker,    breath sounds clear to auscultation       Cardiovascular negative cardio ROS   Rhythm:Regular Rate:Normal     Neuro/Psych  Neuromuscular disease    GI/Hepatic negative GI ROS, Neg liver ROS,   Endo/Other  negative endocrine ROS  Renal/GU negative Renal ROS     Musculoskeletal   Abdominal   Peds  Hematology   Anesthesia Other Findings   Reproductive/Obstetrics                             Anesthesia Physical Anesthesia Plan  ASA: II  Anesthesia Plan: General   Post-op Pain Management:    Induction: Intravenous  PONV Risk Score and Plan: 2 and Ondansetron, Dexamethasone, Propofol infusion, Midazolam and Treatment may vary due to age or medical condition  Airway Management Planned: LMA  Additional Equipment:   Intra-op Plan:   Post-operative Plan: Extubation in OR  Informed Consent: I have reviewed the patients History and Physical, chart, labs and discussed the procedure including the risks, benefits and alternatives for the proposed anesthesia with the patient or authorized representative who has indicated his/her understanding and acceptance.     Plan Discussed with: CRNA, Anesthesiologist and Surgeon  Anesthesia Plan Comments:        Anesthesia Quick Evaluation

## 2017-08-09 MED ORDER — OXYCODONE HCL 5 MG PO TABS: 5.0000 mg | ORAL_TABLET | Freq: Four times a day (QID) | ORAL | 0 refills | Status: DC | PRN

## 2017-08-09 MED ORDER — CEPHALEXIN 500 MG PO CAPS: 500.0000 mg | ORAL_CAPSULE | Freq: Four times a day (QID) | ORAL | 0 refills | Status: AC

## 2017-08-09 NOTE — Anesthesia Postprocedure Evaluation (Signed)
Anesthesia Post Note  Patient: [name]  Procedure(s) Performed: Procedure(s) (LRB): IRRIGATION AND DEBRIDEMENT LEFT HAND, REPAIR OF HAND LACERATION, ORIF PROXIMAL PHALANX METACARPAL HEAD MIDDLE FINGER, EXTENSOR TENDON REPAIR MIDDLE AND INDEX FINGER   (Left)     Patient location during evaluation: PACU Anesthesia Type: General Level of consciousness: awake Pain management: pain level controlled Vital Signs Assessment: post-procedure vital signs reviewed and stable Respiratory status: spontaneous breathing Cardiovascular status: stable Anesthetic complications: no    Last Vitals:  Vitals:   08/08/17 2145 08/08/17 2159  BP: (!) 125/96 133/84  Pulse: 87 86  Resp: 13 16  Temp: 36.8 C 36.8 C  SpO2: 100% 98%    Last Pain:  Vitals:   08/08/17 2331  TempSrc:   PainSc: Asleep                 Kenndra Morris

## 2017-08-09 NOTE — Care Management Note (Signed)
Case Management Note  Patient Details  Name: Erby PianSherri D Cardiff MRN: [id number] Date of Birth: 1966-09-22  Subjective/Objective:  51 y.o. With Saw Injury to L hand. POD#1. No HH needs.                   Action/Plan:CM will sign off for now but will be available should additional discharge needs arise or disposition change.    Expected Discharge Date:  08/09/17               Expected Discharge Plan:     In-House Referral:     Discharge planning Services  CM Consult  Post Acute Care Choice:  NA Choice offered to:  Patient  DME Arranged:    DME Agency:     HH Arranged:    HH Agency:     Status of Service:  Completed, signed off  If discussed at [employer] Length of Stay Meetings, dates discussed:    Additional Comments:  Yvone NeuCrutchfield, Audiel Scheiber M, RN 08/09/2017, 1:43 PM

## 2017-08-09 NOTE — Care Management CC44 (Signed)
Condition Code 44 Documentation Completed  Patient Details  Name: Loretta [name] MRN: [id number] Date of Birth: 1966-02-13   Condition Code 44 given:  Yes Patient signature on Condition Code 44 notice:  Yes Documentation of 2 MD's agreement:  Yes Code 44 added to claim:  Yes    Rilya Longo, Derrill MemoGenese M, RN 08/09/2017, 1:42 PM

## 2017-08-09 NOTE — Discharge Instructions (Signed)

## 2017-08-09 NOTE — Care Management Obs Status (Signed)
MEDICARE OBSERVATION STATUS NOTIFICATION   Patient Details  Name: Loretta [name] MRN: [id number] Date of Birth: 01/04/1966   Medicare Observation Status Notification Given:  Yes    CrutchfieldDerrill Memo, Xana Bradt M, RN 08/09/2017, 1:42 PM

## 2017-08-09 NOTE — Progress Notes (Signed)
Orthopedic Tech Progress Note Patient Details:  Loretta PianSherri D Greener [birth year] [id number]  Ortho Devices Type of Ortho Device: Arm sling Ortho Device/Splint Location: LUE Ortho Device/Splint Interventions: Ordered, Application   Jennye MoccasinHughes, Gwendoline Judy Craig 08/09/2017, 3:19 PM
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
## 2017-08-11 NOTE — Op Note (Signed)
NAME:  Loretta [name], Loretta [name]                     ACCOUNT NO.:  MEDICAL RECORD NO.:  [id number]  LOCATION:                                 FACILITY:  PHYSICIAN:  Dionne AnoWilliam M. Eulis Salazar, M.D.     DATE OF BIRTH:  DATE OF PROCEDURE: DATE OF DISCHARGE:                              OPERATIVE REPORT   PREOPERATIVE DIAGNOSIS:  Table saw injury, left hand with extensor tendon laceration, open third metacarpophalangeal joint injury including proximal phalanx and metacarpal head and severe destruction of soft tissue.  POSTOPERATIVE DIAGNOSIS:  Table saw injury, left hand with extensor tendon laceration, open third metacarpophalangeal joint injury including proximal phalanx and metacarpal head and severe destruction of soft tissue.  PROCEDURES: 1. Irrigation and debridement of open fracture and devitalizing,     mangling injury secondary to a table saw, this includes skin,     subcutaneous tissue, bone, tendon, muscle and associated     structures.  This was accomplished with scissor, curette and knife.     This was an excisional debridement.  Depth was 1.5-2 cm, length was     8 cm. 2. Open reduction and internal fixation of proximal phalanx fracture,     left middle finger. 3. Open reduction and internal fixation with allograft, bone graft,     metacarpal fracture, intra-articular, left hand about the third     metacarpal. 4. Four-view radiographic series, left hand. 5. Extensor indicis proprius repair reconstruction, left index finger. 6. Sagittal band reconstruction and extensor digitorum communis     reconstruction, left middle finger at the metacarpophalangeal     region. 7. Application of spanning plate about the left hand for fixation     purposes. 8. Dorsal sensory nerve neurolysis about the terminal division of the     radial sensory nerve branch, left hand. 9. Rotation flap coverage, left hand.  SURGEON:  Dionne AnoWilliam M. Amanda PeaGramig, M.D.  ASSISTANT:  None.  COMPLICATIONS:   None.  ANESTHESIA:  General.  TOURNIQUET TIME:  Less than 2 hours.  INDICATIONS:  A 51 year old female who presents with the above-mentioned diagnosis.  I have counseled the patient in regard to risks and benefits of surgery, and she desires to proceed.  All questions have been encouraged and answered preoperatively.  This is an unfortunate injury with a table saw.  She has not used to working a saw she states and this went over the top of her left hand. She has open fractures and soft tissue disarray as outlined.  OPERATION IN DETAIL:  The patient was seen by myself and Anesthesia, given a general anesthetic, counseled extensively preoperatively and then underwent two separate Hibiclens scrubs by myself about the affected area.  I then insufflated the tourniquet and performed a 10- minute Betadine scrub and paint.  Outline marks were made visually.  The patient had the tourniquet inflated and the time-out had been called. Preoperative antibiotics were given in the form of Ancef and the patient then underwent a very careful and cautious approach to the extremity with an I and D utilizing curette, knife and scissor.  This was an irrigation and debridement, excisional in nature of bone, tendon,  muscle, soft tissue including tendon as well as the fat and subcutaneous tissue in the vicinity.  A pre-necrotic nonviable tissue was removed. Following 6 liters placed through and through the area, the patient then had the proximal phalanx and metacarpal addressed.  The proximal phalanx about the third/middle finger proximal phalanx, underwent ORIF with setting technique.  Following this, an osteochondral fracture about the metacarpal was set and I used StaGraft to place this into a proper position.  This was a very difficult fracture as it was an intra-articular MCP joint fracture.  There were complete instability and large amount of bone loss.  Thus, at this time, I placed a spanning  plate.  The Acumed handset was used and a spanning plate was placed.  Eight cortices proximal and distal were utilized with four screws proximal and distal.  The patient tolerated this well.  I checked the alignment carefully.  I placed additional bone graft.  Following this, I then performed an extensor indicis proprius repair of the index finger with a modified K[employer]ler to [employer].  This was an extensor tendon repair about the index finger.  Following this, I performed a very careful and cautious rate ulnar sagittal band and an EDC repair to the middle finger.  The patient tolerated this well.  This was an 8-strand repair to the middle finger and there were no complicating features.  This was performed without difficulty.  The dorsal sensory branch of the radial sensory nerve and underwent a neurolysis in a freeing technique.  The tourniquet was deflated.  Additional 3 liters were placed in the wound for lavage.  Thus, total of 9 liters were placed.  Wound conditions looked pristine.  At this time, I performed a rotation flap coverage of the dorsal apparatus and got good primary closure.  AP, lateral and oblique x-rays were performed, examined and interpreted by myself, and looked to be excellent.  I was pleased with this.  The patient had good refill, soft compartments, no complicating features.  Going forward, I think we have to consider plate removal at 3-6 months pending healing progress.  She may ultimately need an MCP fusion or she may actually get some degree of motion going.  We will have to wait and see.  I was very careful with the collateral ligament architecture as well as not to overtighten the ulnar sagittal band and prevent EDC issues.  Going in to get the plate out, we will need to go from an ulnar to radial direction or with an EDC split technique.  I have discussed with the patient all issues, do's and don'ts, etc.  She will be  admitted for IV antibiotics, general postop observation and other measures.  Should any problems occur, will be immediately available.  Do's and don'ts have been discussed, and all questions have been encouraged and answered.     Dionne Ano. Amanda Pea, M.D.     [facility]  D:  08/08/2017  T:  08/09/2017  Job:  [id number]

## 2017-08-12 ENCOUNTER — Encounter (HOSPITAL_COMMUNITY): Payer: Self-pay | Admitting: Orthopedic Surgery

## 2017-08-20 ENCOUNTER — Ambulatory Visit (HOSPITAL_COMMUNITY)
Admission: RE | Admit: 2017-08-20 | Discharge: 2017-08-20 | Disposition: A | Discharge status: Home / Self Care | Payer: Medicare PPO | Source: Ambulatory Visit | Attending: Orthopedic Surgery | Admitting: Orthopedic Surgery

## 2017-08-20 ENCOUNTER — Other Ambulatory Visit: Payer: Self-pay | Admitting: Orthopedic Surgery

## 2017-08-20 DIAGNOSIS — T814XXA Infection following a procedure, initial encounter: Secondary | ICD-10-CM | POA: Insufficient documentation

## 2017-08-20 DIAGNOSIS — X58XXXA Exposure to other specified factors, initial encounter: Secondary | ICD-10-CM | POA: Diagnosis not present

## 2017-08-20 MED ORDER — VANCOMYCIN HCL IN DEXTROSE 1-5 GM/200ML-% IV SOLN: 1000.0000 mg | Freq: Every day | INTRAVENOUS | Status: DC

## 2017-08-20 MED ORDER — VANCOMYCIN HCL IN DEXTROSE 1-5 GM/200ML-% IV SOLN
1000.0000 mg | Freq: Every day | INTRAVENOUS | Status: DC
  Filled 2017-08-20: qty 200

## 2017-08-20 MED ORDER — VANCOMYCIN HCL 10 G IV SOLR: 1000.0000 mg | Freq: Once | INTRAVENOUS | Status: DC

## 2017-08-20 MED ORDER — VANCOMYCIN HCL IN DEXTROSE 1-5 GM/200ML-% IV SOLN
1000.0000 mg | Freq: Every day | INTRAVENOUS | Status: DC
  Administered 2017-08-20: 1000 mg via INTRAVENOUS

## 2017-08-21 ENCOUNTER — Encounter (HOSPITAL_COMMUNITY)
Admission: RE | Admit: 2017-08-21 | Discharge: 2017-08-21 | Disposition: A | Discharge status: Home / Self Care | Payer: Medicare PPO | Source: Ambulatory Visit | Attending: Orthopedic Surgery | Admitting: Orthopedic Surgery

## 2017-08-21 ENCOUNTER — Other Ambulatory Visit (HOSPITAL_COMMUNITY): Payer: Self-pay | Admitting: *Deleted

## 2017-08-21 DIAGNOSIS — Z298 Encounter for other specified prophylactic measures: Secondary | ICD-10-CM | POA: Insufficient documentation

## 2017-08-21 MED ORDER — VANCOMYCIN HCL 1000 MG IV SOLR: 1000.0000 mg | Freq: Every day | INTRAVENOUS | Status: DC

## 2017-08-21 MED ORDER — VANCOMYCIN HCL IN DEXTROSE 1-5 GM/200ML-% IV SOLN
1000.0000 mg | Freq: Once | INTRAVENOUS | Status: AC
  Administered 2017-08-21: 1000 mg via INTRAVENOUS
  Filled 2017-08-21: qty 200

## 2017-08-22 ENCOUNTER — Encounter (HOSPITAL_COMMUNITY)
Admission: RE | Admit: 2017-08-22 | Discharge: 2017-08-22 | Disposition: A | Discharge status: Home / Self Care | Payer: Medicare PPO | Source: Ambulatory Visit | Attending: Orthopedic Surgery | Admitting: Orthopedic Surgery

## 2017-08-22 DIAGNOSIS — Z298 Encounter for other specified prophylactic measures: Secondary | ICD-10-CM | POA: Diagnosis not present

## 2017-08-22 MED ORDER — VANCOMYCIN HCL IN DEXTROSE 1-5 GM/200ML-% IV SOLN
1000.0000 mg | Freq: Once | INTRAVENOUS | Status: DC
  Filled 2017-08-22: qty 200

## 2017-12-31 ENCOUNTER — Encounter (HOSPITAL_COMMUNITY): Payer: Self-pay | Admitting: Emergency Medicine

## 2017-12-31 ENCOUNTER — Emergency Department (HOSPITAL_COMMUNITY)
Admission: EM | Admit: 2017-12-31 | Discharge: 2017-12-31 | Disposition: A | Discharge status: Home / Self Care | Payer: Medicare PPO | Attending: Emergency Medicine | Admitting: Emergency Medicine

## 2017-12-31 ENCOUNTER — Other Ambulatory Visit: Payer: Self-pay

## 2017-12-31 DIAGNOSIS — K029 Dental caries, unspecified: Secondary | ICD-10-CM | POA: Insufficient documentation

## 2017-12-31 DIAGNOSIS — F17210 Nicotine dependence, cigarettes, uncomplicated: Secondary | ICD-10-CM | POA: Diagnosis not present

## 2017-12-31 DIAGNOSIS — Z79899 Other long term (current) drug therapy: Secondary | ICD-10-CM | POA: Diagnosis not present

## 2017-12-31 DIAGNOSIS — K0889 Other specified disorders of teeth and supporting structures: Secondary | ICD-10-CM | POA: Diagnosis present

## 2017-12-31 MED ORDER — ACETAMINOPHEN 500 MG PO TABS
1000.0000 mg | ORAL_TABLET | Freq: Once | ORAL | Status: AC
  Administered 2017-12-31: 1000 mg via ORAL
  Filled 2017-12-31: qty 2

## 2017-12-31 MED ORDER — AMOXICILLIN 250 MG PO CAPS
500.0000 mg | ORAL_CAPSULE | Freq: Once | ORAL | Status: AC
  Administered 2017-12-31: 500 mg via ORAL
  Filled 2017-12-31: qty 2

## 2017-12-31 MED ORDER — AMOXICILLIN 500 MG PO CAPS: 500.0000 mg | ORAL_CAPSULE | Freq: Three times a day (TID) | ORAL | 0 refills | Status: DC

## 2017-12-31 MED ORDER — IBUPROFEN 400 MG PO TABS
400.0000 mg | ORAL_TABLET | Freq: Once | ORAL | Status: AC
  Administered 2017-12-31: 400 mg via ORAL
  Filled 2017-12-31: qty 1
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
## 2017-12-31 NOTE — ED Triage Notes (Signed)
Pt reports right sided dental pain since last night. Pt reports "that tooth is cracked." nad noted. Airway patent.

## 2017-12-31 NOTE — Discharge Instructions (Signed)
Use Amoxil 3 times daily with food.  Use ibuprofen with breakfast, lunch, dinner, and at bedtime.  May use Tylenol in between the ibuprofen doses.  It is important that you see a dentist as soon as possible to rectify the situation.

## 2018-02-25 IMAGING — CR DG HAND COMPLETE 3+V*L*
3 series · 3 of 3 positions shown · non-contrast
Comparison: None.

CLINICAL DATA: Acute hand pain following table saw injury. Initial
encounter.

EXAM:
LEFT HAND - COMPLETE 3+ VIEW

[hand pa]
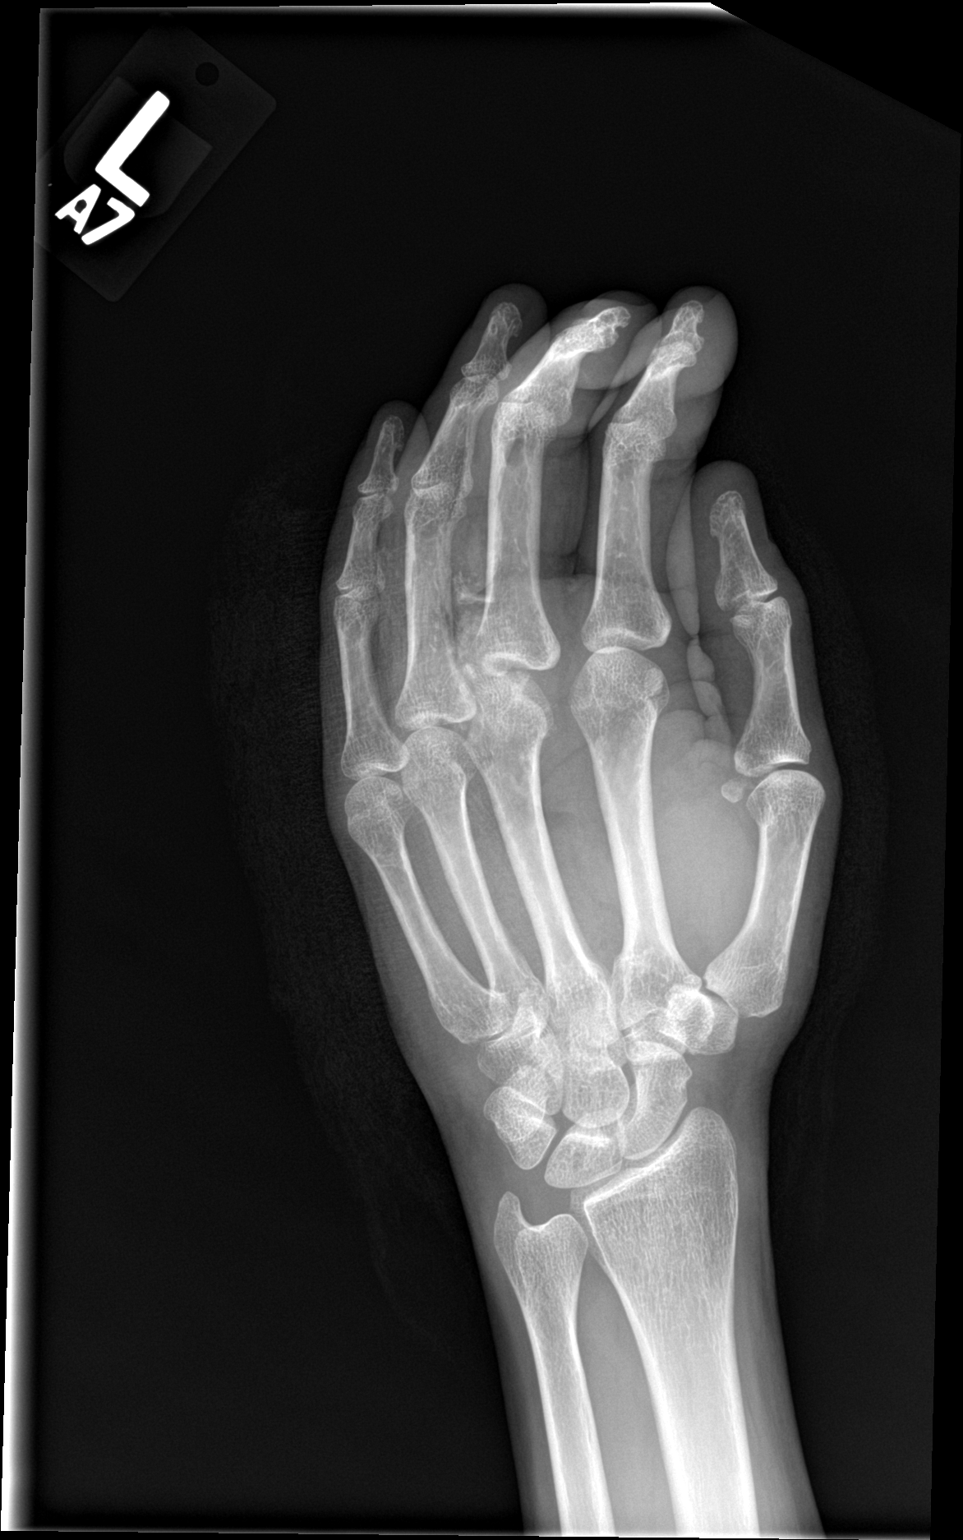

[hand obl]
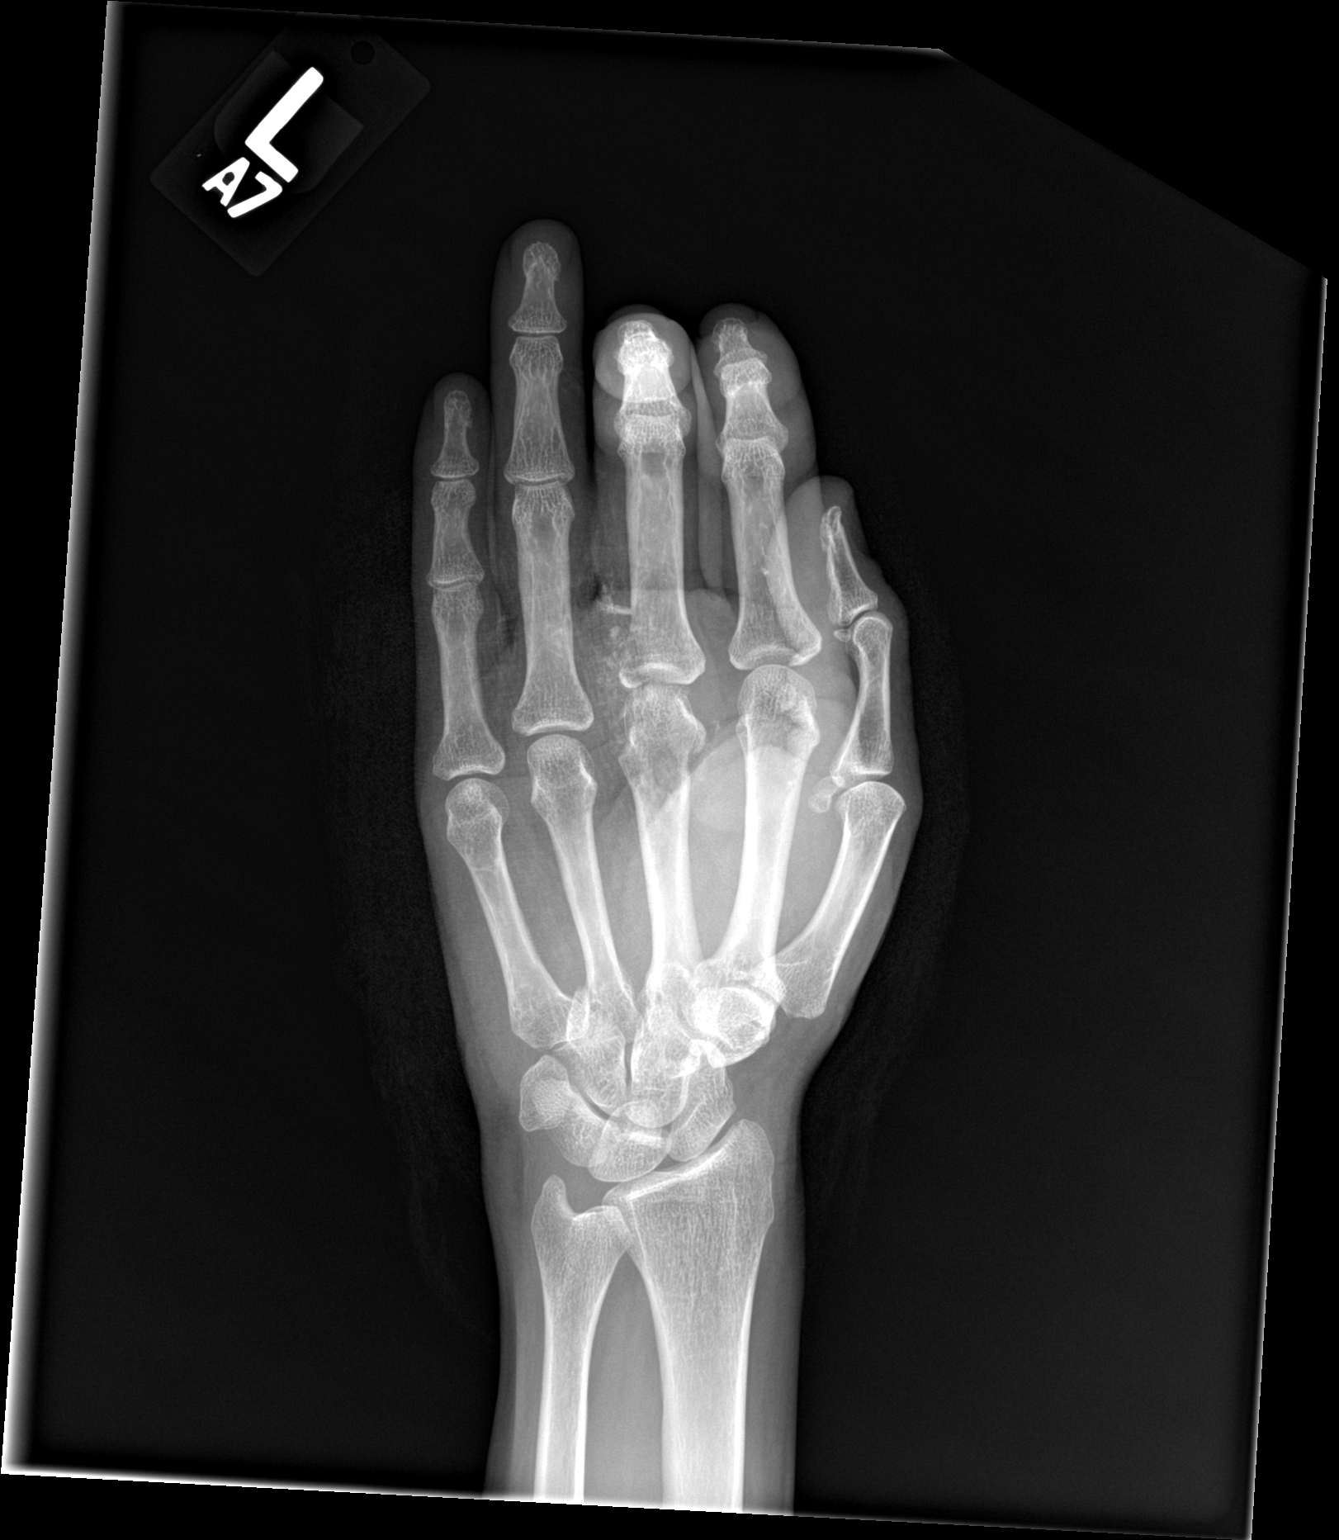

[hand lat]
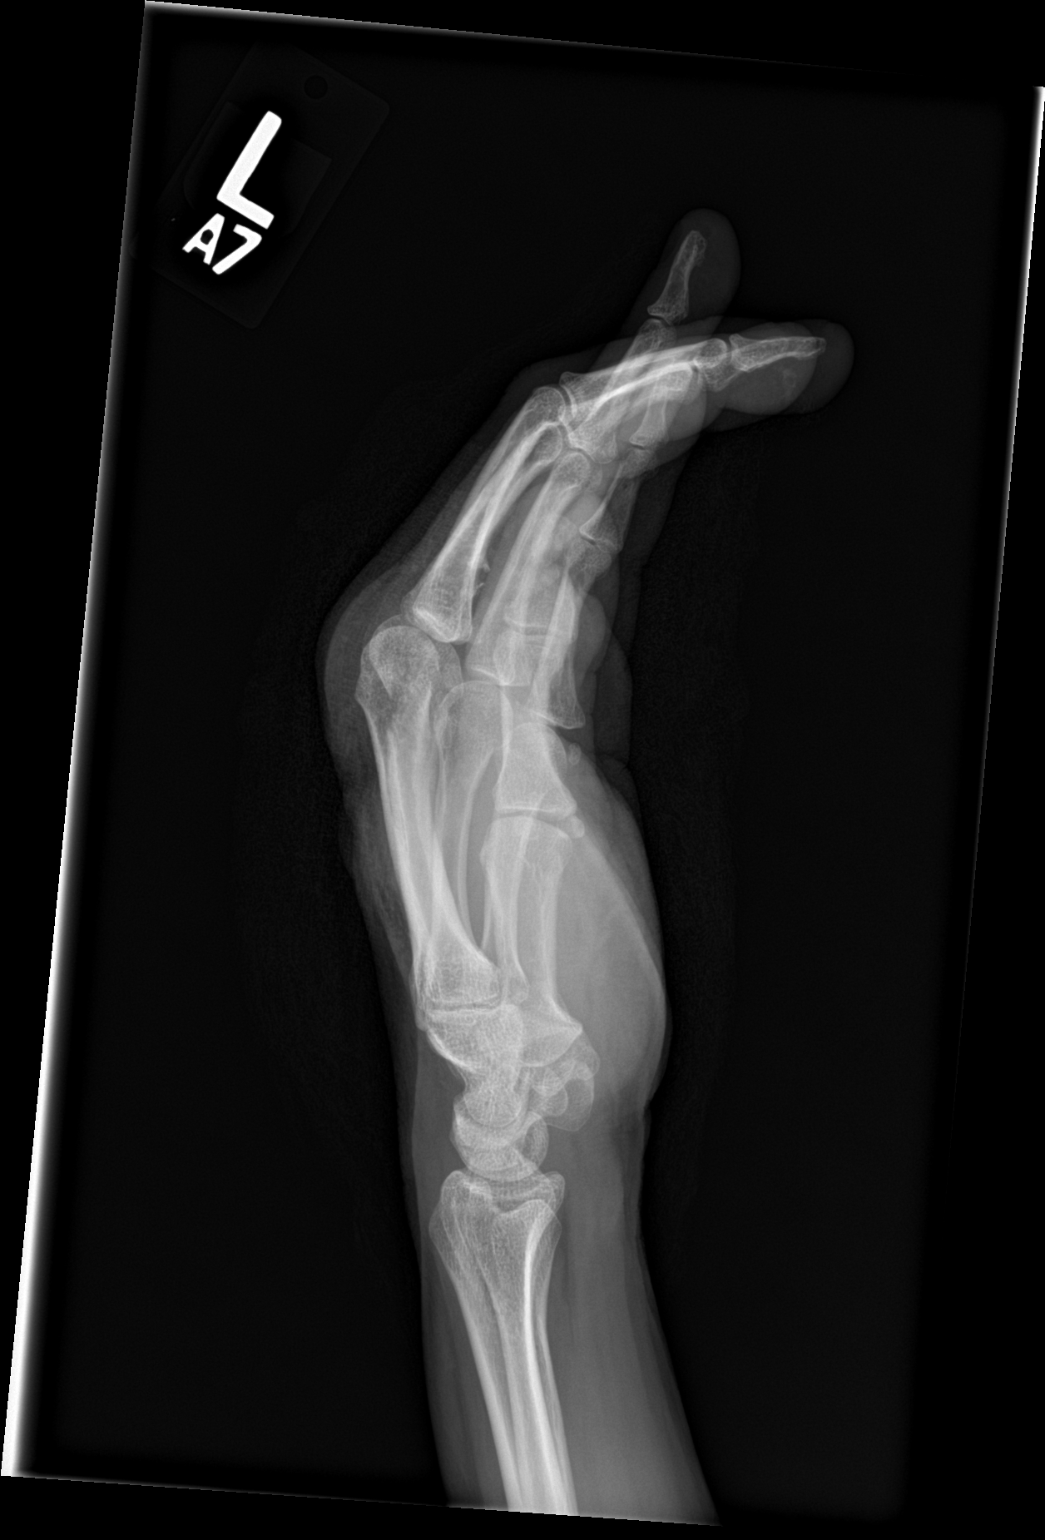

[3 of 3 positions shown; findings below may reference images not displayed]

FINDINGS: Intraarticular fractures of the third metatarsal head and base of
the middle finger proximal phalanx noted with small adjacent bony
fragments.

No other fractures are identified.

There is no evidence of dislocation.

Soft tissue swelling overlying the third MCP joint noted.
IMPRESSION: Fractures of the third metatarsal head and base of the middle finger
proximal phalanx with small adjacent bony fragments.

## 2018-10-27 ENCOUNTER — Other Ambulatory Visit: Payer: Self-pay | Admitting: Registered Nurse

## 2018-10-27 ENCOUNTER — Encounter (HOSPITAL_COMMUNITY): Payer: Self-pay | Admitting: *Deleted

## 2018-10-27 ENCOUNTER — Inpatient Hospital Stay (HOSPITAL_COMMUNITY)
Admission: RE | Admit: 2018-10-27 | Discharge: 2018-10-30 | DRG: 885 | Disposition: A | Discharge status: Home / Self Care | Payer: Medicare HMO | Attending: Psychiatry | Admitting: Psychiatry

## 2018-10-27 ENCOUNTER — Other Ambulatory Visit: Payer: Self-pay

## 2018-10-27 DIAGNOSIS — M5430 Sciatica, unspecified side: Secondary | ICD-10-CM | POA: Diagnosis present

## 2018-10-27 DIAGNOSIS — Z79890 Hormone replacement therapy (postmenopausal): Secondary | ICD-10-CM

## 2018-10-27 DIAGNOSIS — Z79899 Other long term (current) drug therapy: Secondary | ICD-10-CM

## 2018-10-27 DIAGNOSIS — F1920 Other psychoactive substance dependence, uncomplicated: Secondary | ICD-10-CM

## 2018-10-27 DIAGNOSIS — G8929 Other chronic pain: Secondary | ICD-10-CM | POA: Diagnosis present

## 2018-10-27 DIAGNOSIS — Z79891 Long term (current) use of opiate analgesic: Secondary | ICD-10-CM | POA: Diagnosis not present

## 2018-10-27 DIAGNOSIS — R45851 Suicidal ideations: Secondary | ICD-10-CM | POA: Diagnosis present

## 2018-10-27 DIAGNOSIS — E039 Hypothyroidism, unspecified: Secondary | ICD-10-CM | POA: Diagnosis present

## 2018-10-27 DIAGNOSIS — F4310 Post-traumatic stress disorder, unspecified: Secondary | ICD-10-CM | POA: Diagnosis present

## 2018-10-27 DIAGNOSIS — G4700 Insomnia, unspecified: Secondary | ICD-10-CM | POA: Diagnosis present

## 2018-10-27 DIAGNOSIS — F314 Bipolar disorder, current episode depressed, severe, without psychotic features: Secondary | ICD-10-CM

## 2018-10-27 DIAGNOSIS — F3130 Bipolar disorder, current episode depressed, mild or moderate severity, unspecified: Principal | ICD-10-CM | POA: Diagnosis present

## 2018-10-27 DIAGNOSIS — F17210 Nicotine dependence, cigarettes, uncomplicated: Secondary | ICD-10-CM | POA: Diagnosis present

## 2018-10-27 DIAGNOSIS — F1290 Cannabis use, unspecified, uncomplicated: Secondary | ICD-10-CM | POA: Diagnosis present

## 2018-10-27 DIAGNOSIS — Z7952 Long term (current) use of systemic steroids: Secondary | ICD-10-CM

## 2018-10-27 DIAGNOSIS — F329 Major depressive disorder, single episode, unspecified: Secondary | ICD-10-CM | POA: Diagnosis present

## 2018-10-27 DIAGNOSIS — F1120 Opioid dependence, uncomplicated: Secondary | ICD-10-CM | POA: Diagnosis present

## 2018-10-27 DIAGNOSIS — Z915 Personal history of self-harm: Secondary | ICD-10-CM

## 2018-10-27 DIAGNOSIS — Z882 Allergy status to sulfonamides status: Secondary | ICD-10-CM | POA: Diagnosis not present

## 2018-10-27 LAB — CBC
HCT: 42.3 % (ref 36.0–46.0)
Hemoglobin: 13.2 g/dL (ref 12.0–15.0)
MCH: 28.9 pg (ref 26.0–34.0)
MCHC: 31.2 g/dL (ref 30.0–36.0)
MCV: 92.8 fL (ref 80.0–100.0)
NRBC: 0 % (ref 0.0–0.2)
PLATELETS: 314 10*3/uL (ref 150–400)
RBC: 4.56 MIL/uL (ref 3.87–5.11)
RDW: 12.7 % (ref 11.5–15.5)
WBC: 7 10*3/uL (ref 4.0–10.5)

## 2018-10-27 LAB — COMPREHENSIVE METABOLIC PANEL
ALT: 54 U/L — AB (ref 0–44)
AST: 39 U/L (ref 15–41)
Albumin: 3.8 g/dL (ref 3.5–5.0)
Alkaline Phosphatase: 63 U/L (ref 38–126)
Anion gap: 8 (ref 5–15)
BILIRUBIN TOTAL: 0.6 mg/dL (ref 0.3–1.2)
BUN: 10 mg/dL (ref 6–20)
CALCIUM: 8.8 mg/dL — AB (ref 8.9–10.3)
CO2: 29 mmol/L (ref 22–32)
CREATININE: 0.78 mg/dL (ref 0.44–1.00)
Chloride: 104 mmol/L (ref 98–111)
GFR calc Af Amer: >60 mL/min (ref >60)
Glucose, Bld: 93 mg/dL (ref 70–99)
Potassium: 4 mmol/L (ref 3.5–5.1)
Sodium: 141 mmol/L (ref 135–145)
TOTAL PROTEIN: 6.9 g/dL (ref 6.5–8.1)

## 2018-10-27 LAB — HEMOGLOBIN A1C
Hgb A1c MFr Bld: 5.6 % (ref 4.8–5.6)
Mean Plasma Glucose: 114.02 mg/dL

## 2018-10-27 LAB — LIPID PANEL
Cholesterol: 196 mg/dL (ref 0–200)
HDL: 42 mg/dL (ref >40)
LDL Cholesterol: 113 mg/dL — ABNORMAL HIGH (ref 0–99)
Total CHOL/HDL Ratio: 4.7 RATIO
Triglycerides: 204 mg/dL — ABNORMAL HIGH (ref <150)
VLDL: 41 mg/dL — ABNORMAL HIGH (ref 0–40)

## 2018-10-27 LAB — ETHANOL: Alcohol, Ethyl (B): <10 mg/dL (ref <10)

## 2018-10-27 LAB — TSH: TSH: 8.887 u[IU]/mL — ABNORMAL HIGH (ref 0.350–4.500)

## 2018-10-27 MED ORDER — NICOTINE 21 MG/24HR TD PT24
21.0000 mg | MEDICATED_PATCH | Freq: Every day | TRANSDERMAL | Status: DC
  Administered 2018-10-28 – 2018-10-30 (×2): 21 mg via TRANSDERMAL
  Filled 2018-10-27 (×6): qty 1

## 2018-10-27 MED ORDER — PNEUMOCOCCAL VAC POLYVALENT 25 MCG/0.5ML IJ INJ: 0.5000 mL | INJECTION | INTRAMUSCULAR | Status: DC

## 2018-10-27 MED ORDER — INFLUENZA VAC SPLIT QUAD 0.5 ML IM SUSY
0.5000 mL | PREFILLED_SYRINGE | INTRAMUSCULAR | Status: DC
  Filled 2018-10-27: qty 0.5

## 2018-10-27 NOTE — [facility] Group Notes (Signed)
[facility] Group Notes:  (Nursing/MHT/Case Management/Adjunct)  Date:  10/27/2018  Time:  4:00 pm  Type of Therapy:  Psychoeducational Skills  Participation Level:  Did Not Attend  Participation Quality:    Affect:    Cognitive:    Insight:    Engagement in Group:    Modes of Intervention:    Summary of Progress/Problems:  Earline MayotteKnight, Miya Luviano Shephard 10/27/2018, 5:41 PM
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
## 2018-10-27 NOTE — Tx Team (Signed)
Initial Treatment Plan 10/27/2018 1:32 PM Loretta PianSherri D Fetty JXB:[id number]    PATIENT STRESSORS: Marital or family conflict Other: break up with boyfriend   PATIENT STRENGTHS: Ability for insight Average or above average intelligence Capable of independent living General fund of knowledge Motivation for treatment/growth Supportive family/friends   PATIENT IDENTIFIED PROBLEMS: Depression Suicidal thoughts "I would like to get back on my medications and feel better"                     DISCHARGE CRITERIA:  Ability to meet basic life and health needs Improved stabilization in mood, thinking, and/or behavior Reduction of life-threatening or endangering symptoms to within safe limits Verbal commitment to aftercare and medication compliance  PRELIMINARY DISCHARGE PLAN: Attend aftercare/continuing care group Return to previous living arrangement  PATIENT/FAMILY INVOLVEMENT: This treatment plan has been presented to and reviewed with the patient, Loretta [name], and/or family member, .  The patient and family have been given the opportunity to ask questions and make suggestions.  Delmas Faucett, [place], [place] 10/27/2018, 1:32 PM
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
## 2018-10-28 DIAGNOSIS — F1920 Other psychoactive substance dependence, uncomplicated: Secondary | ICD-10-CM

## 2018-10-28 DIAGNOSIS — F314 Bipolar disorder, current episode depressed, severe, without psychotic features: Secondary | ICD-10-CM

## 2018-10-28 DIAGNOSIS — F4310 Post-traumatic stress disorder, unspecified: Secondary | ICD-10-CM

## 2018-10-28 LAB — RAPID URINE DRUG SCREEN, HOSP PERFORMED
Amphetamines: POSITIVE — AB
BARBITURATES: NOT DETECTED
Benzodiazepines: NOT DETECTED
Cocaine: NOT DETECTED
Opiates: NOT DETECTED
Tetrahydrocannabinol: NOT DETECTED

## 2018-10-28 LAB — URINALYSIS, COMPLETE (UACMP) WITH MICROSCOPIC
Bilirubin Urine: NEGATIVE
Glucose, UA: NEGATIVE mg/dL
HGB URINE DIPSTICK: NEGATIVE
KETONES UR: NEGATIVE mg/dL
Leukocytes, UA: NEGATIVE
NITRITE: NEGATIVE
PH: 7 (ref 5.0–8.0)
Protein, ur: NEGATIVE mg/dL
Specific Gravity, Urine: 1.011 (ref 1.005–1.030)

## 2018-10-28 MED ORDER — ARIPIPRAZOLE 5 MG PO TABS
5.0000 mg | ORAL_TABLET | Freq: Every day | ORAL | Status: DC
  Administered 2018-10-28 – 2018-10-30 (×3): 5 mg via ORAL
  Filled 2018-10-28 (×5): qty 1

## 2018-10-28 MED ORDER — HYDROXYZINE HCL 50 MG PO TABS
50.0000 mg | ORAL_TABLET | Freq: Four times a day (QID) | ORAL | Status: DC | PRN
  Administered 2018-10-28 – 2018-10-29 (×2): 50 mg via ORAL
  Filled 2018-10-28 (×2): qty 1

## 2018-10-28 MED ORDER — LEVOTHYROXINE SODIUM 75 MCG PO TABS
75.0000 ug | ORAL_TABLET | Freq: Every day | ORAL | Status: DC
  Administered 2018-10-28 – 2018-10-30 (×3): 75 ug via ORAL
  Filled 2018-10-28 (×5): qty 1

## 2018-10-28 MED ORDER — TRAZODONE HCL 50 MG PO TABS
50.0000 mg | ORAL_TABLET | Freq: Every evening | ORAL | Status: DC | PRN
  Administered 2018-10-28: 50 mg via ORAL
  Filled 2018-10-28 (×2): qty 1

## 2018-10-28 MED ORDER — BUPRENORPHINE HCL-NALOXONE HCL 8-2 MG SL SUBL
1.0000 | SUBLINGUAL_TABLET | Freq: Every day | SUBLINGUAL | Status: DC
  Administered 2018-10-28 – 2018-10-30 (×3): 1 via SUBLINGUAL
  Filled 2018-10-28 (×3): qty 1

## 2018-10-28 MED ORDER — SERTRALINE HCL 50 MG PO TABS
50.0000 mg | ORAL_TABLET | Freq: Every day | ORAL | Status: DC
  Administered 2018-10-28 – 2018-10-30 (×3): 50 mg via ORAL
  Filled 2018-10-28 (×5): qty 1

## 2018-10-28 NOTE — Tx Team (Addendum)
Interdisciplinary Treatment and Diagnostic Plan Update  10/29/2018 Time of Session: 4[address] K[address] G[address]VEIA MRN: [id number]  Principal Diagnosis: Bipolar I disorder, most recent episode depressed, severe without psychotic features ([place])  Secondary Diagnoses: Principal Problem:   Bipolar I disorder, most recent episode depressed, severe without psychotic features ([place]) Active Problems:   PTSD (post-traumatic stress disorder)   Polysubstance dependence ([place])   Current Medications:  Current Facility-Administered Medications  Medication Dose Route Frequency Provider Last Rate Last Dose  . ARIPiprazole (ABILIFY) tablet 5 mg  5 mg Oral Daily Pennelope Bracken, MD   5 mg at 10/29/18 0825  . buprenorphine-naloxone (SUBOXONE) 8-2 mg per SL tablet 1 tablet  1 tablet Sublingual Daily Pennelope Bracken, MD   1 tablet at 10/29/18 0825  . hydrOXYzine (ATARAX/VISTARIL) tablet 50 mg  50 mg Oral Q6H PRN Pennelope Bracken, MD   50 mg at 10/29/18 1212  . Influenza vac split quadrivalent PF (FLUARIX) injection 0.5 mL  0.5 mL Intramuscular Tomorrow-1000 [place], Christopher T, MD      . levothyroxine (SYNTHROID, LEVOTHROID) tablet 75 mcg  75 mcg Oral Q0600 Pennelope Bracken, MD   75 mcg at 10/29/18 0640  . nicotine (NICODERM CQ - dosed in mg/24 hours) patch 21 mg  21 mg Transdermal Daily Pennelope Bracken, MD   21 mg at 10/28/18 0831  . pneumococcal 23 valent vaccine (PNU-IMMUNE) injection 0.5 mL  0.5 mL Intramuscular Tomorrow-1000 Maris Berger T, MD      . sertraline (ZOLOFT) tablet 50 mg  50 mg Oral Daily Pennelope Bracken, MD   50 mg at 10/29/18 0825  . traZODone (DESYREL) tablet 50 mg  50 mg Oral QHS PRN,MR X 1 Pennelope Bracken, MD   50 mg at 10/28/18 2111   PTA Medications: Facility-Administered Medications Prior to Admission  Medication Dose Route Frequency Provider Last Rate Last Dose  . vancomycin (VANCOCIN) 1,000 mg in sodium chloride 0.9 %  500 mL IVPB  1,000 mg Intravenous Once Avelina Laine, PA-C       Medications Prior to Admission  Medication Sig Dispense Refill Last Dose  . Buprenorphine HCl-Naloxone HCl 8-2 MG FILM Place 1 Film under the tongue 2 (two) times daily.   Past Week at Unknown time  . amoxicillin (AMOXIL) 500 MG capsule Take 1 capsule (500 mg total) by mouth 3 (three) times daily. (Patient not taking: Reported on 10/27/2018) 21 capsule 0 Completed Course at Unknown time  . LORazepam (ATIVAN) 1 MG tablet Take 1 tablet (1 mg total) by mouth every 8 (eight) hours as needed for anxiety. (Patient not taking: Reported on 02/22/2015) 10 tablet 0 Completed Course at Unknown time  . oxyCODONE (OXY IR/ROXICODONE) 5 MG immediate release tablet Take 1-2 tablets (5-10 mg total) by mouth every 6 (six) hours as needed for moderate pain (do not take Suboxone while on pain medicine. You should take your pain medicine for 10 days then go back on your regular Suboxone and discontinue all narcotics). (Patient not taking: Reported on 10/27/2018) 40 tablet 0 Completed Course at Unknown time  . predniSONE (DELTASONE) 10 MG tablet Take 6 tablets day one, 5 tablets day two, 4 tablets day three, 3 tablets day four, 2 tablets day five, then 1 tablet day six (Patient not taking: Reported on 08/08/2017) 21 tablet 0 Completed Course at Unknown time    Patient Stressors: Marital or family conflict Other: break up with boyfriend  Patient Strengths: Ability for insight Average or above average intelligence  Capable of independent living General fund of knowledge Motivation for treatment/growth Supportive family/friends  Treatment Modalities: Medication Management, Group therapy, Case management,  1 to 1 session with clinician, Psychoeducation, Recreational therapy.   Physician Treatment Plan for Primary Diagnosis: Bipolar I disorder, most recent episode depressed, severe without psychotic features ([place]) Long Term Goal(s): Improvement in  symptoms so as ready for discharge Improvement in symptoms so as ready for discharge   Short Term Goals: Ability to identify and develop effective coping behaviors will improve Ability to demonstrate self-control will improve Ability to identify and develop effective coping behaviors will improve Ability to identify triggers associated with substance abuse/mental health issues will improve  Medication Management: Evaluate patient's response, side effects, and tolerance of medication regimen.  Therapeutic Interventions: 1 to 1 sessions, Unit Group sessions and Medication administration.  Evaluation of Outcomes: Not Met  Physician Treatment Plan for Secondary Diagnosis: Principal Problem:   Bipolar I disorder, most recent episode depressed, severe without psychotic features ([place]) Active Problems:   PTSD (post-traumatic stress disorder)   Polysubstance dependence ([place])  Long Term Goal(s): Improvement in symptoms so as ready for discharge Improvement in symptoms so as ready for discharge   Short Term Goals: Ability to identify and develop effective coping behaviors will improve Ability to demonstrate self-control will improve Ability to identify and develop effective coping behaviors will improve Ability to identify triggers associated with substance abuse/mental health issues will improve     Medication Management: Evaluate patient's response, side effects, and tolerance of medication regimen.  Therapeutic Interventions: 1 to 1 sessions, Unit Group sessions and Medication administration.  Evaluation of Outcomes: Not Met   RN Treatment Plan for Primary Diagnosis: Bipolar I disorder, most recent episode depressed, severe without psychotic features ([place]) Long Term Goal(s): Knowledge of disease and therapeutic regimen to maintain health will improve  Short Term Goals: Ability to identify and develop effective coping behaviors will improve and Compliance with prescribed medications will  improve  Medication Management: RN will administer medications as ordered by provider, will assess and evaluate patient's response and provide education to patient for prescribed medication. RN will report any adverse and/or side effects to prescribing provider.  Therapeutic Interventions: 1 on 1 counseling sessions, Psychoeducation, Medication administration, Evaluate responses to treatment, Monitor vital signs and CBGs as ordered, Perform/monitor CIWA, COWS, AIMS and Fall Risk screenings as ordered, Perform wound care treatments as ordered.  Evaluation of Outcomes: Not Met   LCSW Treatment Plan for Primary Diagnosis: Bipolar I disorder, most recent episode depressed, severe without psychotic features ([place]) Long Term Goal(s): Safe transition to appropriate next level of care at discharge, Engage patient in therapeutic group addressing interpersonal concerns.  Short Term Goals: Engage patient in aftercare planning with referrals and resources, Increase social support and Increase skills for wellness and recovery  Therapeutic Interventions: Assess for all discharge needs, 1 to 1 time with Social worker, Explore available resources and support systems, Assess for adequacy in community support network, Educate family and significant other(s) on suicide prevention, Complete Psychosocial Assessment, Interpersonal group therapy.  Evaluation of Outcomes: Not Met   Progress in Treatment: Attending groups: No. Participating in groups: No. Taking medication as prescribed: Yes. Toleration medication: Yes. Family/Significant other contact made: No, will contact:  when given permission Patient understands diagnosis: Yes. Discussing patient identified problems/goals with staff: Yes. Medical problems stabilized or resolved: Yes. Denies suicidal/homicidal ideation: Yes. Issues/concerns per patient self-inventory: No. Other: none  New problem(s) identified: No, Describe:  none  New Short Term/Long  Term Goal(s):  Patient Goals:    Discharge Plan or Barriers:   Reason for Continuation of Hospitalization: Depression Medication stabilization  Estimated Length of Stay: 3-5 days.  Attendees: Patient: 10/28/2018   Physician: Dr. Nancy Fetter, MD 10/28/2018   Nursing: Megan Mans, RN 10/28/2018   RN Care Manager: 10/28/2018   Social Worker: Lurline Idol, LCSW 10/28/2018   Recreational Therapist:  10/28/2018   Other:  10/28/2018   Other:  10/28/2018   Other: 10/28/2018        Scribe for Treatment Team: Joanne Chars, LCSW 10/29/2018 1:08 PM

## 2018-10-28 NOTE — [facility] Group Notes (Signed)
[facility] Group Notes:  (Nursing/MHT/Case Management/Adjunct)  Date:  10/28/2018  Time:  7:49 PM  Type of Therapy:  Nurse Education  Participation Level:  Did Not Attend  Summary of Progress/Problems: RN led group on "Needs Assessment." We discussed positive self affirmation, the triangle of "emotions," "actions," and "thoughts", and Maslow's hierarchy of needs. We discussed the importance of meeting needs in a healthy manner.   Haydn Cush C Jacody Beneke 10/28/2018, 7:49 PM 
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
## 2018-10-28 NOTE — Progress Notes (Signed)
Recreation Therapy Notes  Date: 11.20.19 Time: 0930 Location: [address] Dayroom  Group Topic: Stress Management  Goal Area(s) Addresses:  Patient will verbalize importance of using healthy stress management.  Patient will identify positive emotions associated with healthy stress management.   Intervention: Stress Management  Activity :  Guided Imagery.  LRT introduced the stress management technique of guided imagery.  LRT read [name] script to guide patients through [name] peaceful meadow.  Patients were to follow along as script was read to engage in activity.  Education:  Stress Management, Discharge Planning.   Education Outcome: Acknowledges edcuation/In group clarification offered/Needs additional education  Clinical Observations/Feedback: Pt did not attend group.    Loretta [name], LRT/CTRS         Loretta [name] 10/28/2018 11:20 AM 

## 2018-10-28 NOTE — [facility] Suicide Risk Assessment (Signed)
[facility] INPATIENT:  Family/Significant Other Suicide Prevention Education  Suicide Prevention Education:  Patient Refusal for Family/Significant Other Suicide Prevention Education: The patient Loretta [name] has refused to provide written consent for family/significant other to be provided Family/Significant Other Suicide Prevention Education during admission and/or prior to discharge.  Physician notified.  .SPE completed with patient, as patient refused to consent to family contact. Patient was encouraged to share information with support network, ask questions, and talk about any concerns relating to SPE. Patient denies access to guns/firearms and verbalized understanding of information provided. Mobile Crisis information also provided to patient.    [name] 10/28/2018, 11:39 AM
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
## 2018-10-28 NOTE — Plan of Care (Signed)
D: Patient presents depressed, flat, cooperative. She denies withdrawal symptoms. She reports tossing and turning last night, and sleeping poorly overall. Patient denies SI/HI/AVH.  A: Patient checked q15 min, and checks reviewed. Reviewed medication changes with patient and educated on side effects. Educated patient on importance of attending group therapy sessions and educated on several coping skills. Encouarged participation in milieu through recreation therapy and attending meals with peers. Support and encouragement provided. Fluids offered. R: Patient receptive to education on medications, and is medication compliant. Patient contracts for safety on the unit.

## 2018-10-28 NOTE — Progress Notes (Signed)
Patient did not attend the evening speaker NA meeting. Pt was notified that group was beginning but remained in bed.   

## 2018-10-28 NOTE — Progress Notes (Signed)
At the beginning of the shift, pt was in her bed with eyes closed, but she responded immediately when writer called her name.  She reports her day was fine, but she was feeling tired and sleepy after dinner.  She did not attend evening NA group.  She denies SI/HI/AVH at this time.  She voiced no needs or concerns except to make sure she would be getting a sleep aid tonight.  Pt has been pleasant and appropriate.  She makes her needs known to staff.  Pt received her sleep aid at bedtime.  Support and encouragement offered.  Discharge plans are in process.  Safety maintained with q15 minute checks.

## 2018-10-28 NOTE — Progress Notes (Signed)
Called [facility] in [place], [place] to confirm Suboxone 8/2mg  prescription. She recently started being seen in the clinic and last received a 7 day supply (14 strips) on 10/13/18. She canceled her last two appointments. Patient should have been out of medication for 8 days now.

## 2018-10-29 NOTE — Plan of Care (Signed)
  Problem: Health Behavior/Discharge Planning: Goal: Compliance with treatment plan for underlying cause of condition will improve Outcome: Progressing   Problem: Physical Regulation: Goal: Ability to maintain clinical measurements within normal limits will improve Outcome: Progressing   Problem: Safety: Goal: Periods of time without injury will increase Outcome: Progressing   

## 2018-10-29 NOTE — Progress Notes (Signed)
Patient ID: Loretta [name], female   DOB: [birth year], 52 y.o.   MRN: [id number]  Nursing Progress Note [id number]  Data: Patient presents with sad/sullen mood this morning. On initial approach patient was in bed but did get up for medications. Patient compliant with scheduled medications. Patient denies pain/physical complaints. Patient returned to bed and has been isolative to her room for all of this morning. Patient provided but declined to complete their self-inventory sheet. Patient currently denies SI/HI/AVH.   Action: Patient is educated about and provided medication per provider's orders. Patient safety maintained with q15 min safety checks and frequent rounding. Low fall risk precautions in place. Emotional support given. 1:1 interaction and active listening provided. Patient encouraged to attend meals, groups, and work on treatment plan and goals. Labs, vital signs and patient behavior monitored throughout shift.   Response: Patient remains safe on the unit at this time and agrees to come to staff with any issues/concerns. Will continue to support and monitor.
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
## 2018-10-29 NOTE — Progress Notes (Signed)
Pt has been in her room in bed all evening.  She did not attend evening wrap-up group.  Writer went to her room to do the evening assessment.  She reports that her day was ok.  She denies SI/HI/AVH. She says she is in bed just because she is tired and does not feel well, but does not give a specific reason why she does not feel well.  Pt voiced no needs or concerns.  Pt was encouraged to make her needs known to staff.  Support and encouragement offered.  Discharge plans are in process.  Pt is being encouraged to consider rehab.  Safety maintained with q15 minute checks.

## 2018-10-30 MED ORDER — HYDROXYZINE HCL 50 MG PO TABS: 50.0000 mg | ORAL_TABLET | Freq: Four times a day (QID) | ORAL | 0 refills | Status: DC | PRN

## 2018-10-30 MED ORDER — TRAZODONE HCL 50 MG PO TABS: 50.0000 mg | ORAL_TABLET | Freq: Every evening | ORAL | 0 refills | Status: DC | PRN

## 2018-10-30 MED ORDER — ARIPIPRAZOLE 5 MG PO TABS: 5.0000 mg | ORAL_TABLET | Freq: Every day | ORAL | 0 refills | Status: DC

## 2018-10-30 MED ORDER — LEVOTHYROXINE SODIUM 75 MCG PO TABS: 75.0000 ug | ORAL_TABLET | Freq: Every day | ORAL | 0 refills | Status: DC

## 2018-10-30 MED ORDER — SERTRALINE HCL 50 MG PO TABS: 50.0000 mg | ORAL_TABLET | Freq: Every day | ORAL | 0 refills | Status: DC
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
## 2018-10-30 NOTE — Progress Notes (Signed)
Pt discharged home with her family member. Pt was ambulatory, stable and appreciative at that time. All papers and prescriptions were given and valuables returned. Verbal understanding expressed. Denies SI/HI and A/VH. Pt given opportunity to express concerns and ask questions.
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
## 2021-05-10 ENCOUNTER — Encounter: Payer: Self-pay | Admitting: Internal Medicine

## 2021-07-11 ENCOUNTER — Encounter: Payer: Self-pay | Admitting: *Deleted

## 2021-07-12 ENCOUNTER — Ambulatory Visit: Payer: Medicare Other | Admitting: Cardiology

## 2021-07-13 ENCOUNTER — Encounter: Payer: Self-pay | Admitting: Gastroenterology

## 2021-07-13 ENCOUNTER — Ambulatory Visit: Payer: Medicare Other | Admitting: Gastroenterology

## 2021-10-22 LAB — TSH: TSH: 2.24 (ref 0.41–5.90)

## 2021-11-14 ENCOUNTER — Ambulatory Visit: Payer: Medicare Other | Admitting: "Endocrinology

## 2021-12-20 ENCOUNTER — Ambulatory Visit (INDEPENDENT_AMBULATORY_CARE_PROVIDER_SITE_OTHER): Payer: Medicare Other | Admitting: "Endocrinology

## 2021-12-20 ENCOUNTER — Other Ambulatory Visit: Payer: Self-pay

## 2021-12-20 ENCOUNTER — Encounter: Payer: Self-pay | Admitting: "Endocrinology

## 2021-12-20 VITALS — BP 160/98 | HR 120 | Ht 64.25 in | Wt 159.6 lb

## 2021-12-20 DIAGNOSIS — E890 Postprocedural hypothyroidism: Secondary | ICD-10-CM
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
## 2021-12-28 ENCOUNTER — Encounter (HOSPITAL_COMMUNITY): Payer: Self-pay | Admitting: *Deleted

## 2021-12-28 ENCOUNTER — Emergency Department (HOSPITAL_COMMUNITY)
Admission: EM | Admit: 2021-12-28 | Discharge: 2021-12-28 | Disposition: A | Discharge status: Home / Self Care | Payer: Medicare Other | Attending: Emergency Medicine | Admitting: Emergency Medicine

## 2021-12-28 ENCOUNTER — Emergency Department (HOSPITAL_COMMUNITY): Payer: Medicare Other

## 2021-12-28 ENCOUNTER — Other Ambulatory Visit: Payer: Self-pay

## 2021-12-28 ENCOUNTER — Emergency Department (HOSPITAL_COMMUNITY)
Admission: RE | Admit: 2021-12-28 | Discharge: 2021-12-28 | Disposition: A | Discharge status: Home / Self Care | Payer: Medicare Other | Source: Ambulatory Visit | Attending: "Endocrinology | Admitting: "Endocrinology

## 2021-12-28 DIAGNOSIS — E890 Postprocedural hypothyroidism: Secondary | ICD-10-CM | POA: Insufficient documentation

## 2021-12-28 DIAGNOSIS — S4992XA Unspecified injury of left shoulder and upper arm, initial encounter: Secondary | ICD-10-CM | POA: Diagnosis present

## 2021-12-28 DIAGNOSIS — X501XXA Overexertion from prolonged static or awkward postures, initial encounter: Secondary | ICD-10-CM | POA: Diagnosis not present

## 2021-12-28 DIAGNOSIS — S46912A Strain of unspecified muscle, fascia and tendon at shoulder and upper arm level, left arm, initial encounter: Secondary | ICD-10-CM | POA: Diagnosis not present

## 2021-12-28 DIAGNOSIS — R000 Tachycardia, unspecified: Secondary | ICD-10-CM | POA: Insufficient documentation

## 2021-12-28 DIAGNOSIS — E039 Hypothyroidism, unspecified: Secondary | ICD-10-CM | POA: Insufficient documentation

## 2021-12-28 LAB — TROPONIN I (HIGH SENSITIVITY): Troponin I (High Sensitivity): <2 ng/L (ref <18)

## 2021-12-28 MED ORDER — LIDOCAINE 5 % EX PTCH
1.0000 | MEDICATED_PATCH | CUTANEOUS | Status: DC
  Administered 2021-12-28: 1 via TRANSDERMAL
  Filled 2021-12-28: qty 1

## 2021-12-28 NOTE — ED Notes (Signed)
Pt d/c home per MD order. Discharge summary reviewed, pt verbalizes understanding. Ambulatory off unit, no s/s of acute distress noted. Discharged home with visitor.

## 2021-12-28 NOTE — Discharge Instructions (Addendum)
Return to ED with any new or worsening symptoms such as chest pain, shortness of breath, excessive sweating, decreased pulses into your left arm Please take 600 mg of ibuprofen every 6 hours.  You may also take 1000 mg of Tylenol every 6 hours.  Studies have shown that using these 2 medications interchangeably results in best pain relief You may utilize IcyHot, Salonpas patches, ice packs or heating pads to the left shoulder for pain relief Please follow-up with the orthopedic doctor, Dr. Amedeo Kinsman, in the next 2 days to make an appointment for further evaluation and management of your left-sided shoulder pain.

## 2021-12-28 NOTE — ED Provider Notes (Signed)
[name]   CSN: [id number] Arrival date & time: 12/28/21  1234     History  Chief Complaint  Patient presents with   Shoulder Pain    Erby PianSherri D [name] is a 56 y.o. female with medical history of chronic back pain, hypothyroid, sciatica, anxiety.  Patient presents due to left-sided shoulder pain that began 2 weeks ago when the patient was reaching behind her to clasp her bra.  Patient reports sudden onset of pain at this time.  Patient states that the pain is worsened when she reaches behind her back and describes the pain is "burning, aching".  Patient has attempted to relieve pain utilizing over-the-counter Tylenol with moderate relief of symptoms.  Patient reports that the pain consistently returns.  Patient endorses left-sided shoulder pain.  Patient denies chest pain, shortness of breath, nausea, vomiting, abdominal pain.   Shoulder Pain Associated symptoms: no fever       Home Medications Prior to Admission medications   Medication Sig Start Date End Date Taking? Authorizing Provider  Buprenorphine HCl-Naloxone HCl 8-2 MG FILM Place 1 Film under the tongue 2 (two) times daily. Patient not taking: Reported on 12/20/2021 10/19/18   [provider]  cariprazine (VRAYLAR) 1.5 MG capsule Take 1 tablet by mouth daily.    [provider]  clindamycin (CLEOCIN) 300 MG capsule Take 300 mg by mouth 4 (four) times daily. 11/29/21   [provider]  clonazePAM (KLONOPIN) 1 MG tablet Take 1 tablet by mouth at bedtime as needed. 11/05/21   [provider]  levothyroxine (SYNTHROID, LEVOTHROID) 75 MCG tablet Take 1 tablet (75 mcg total) by mouth daily at 6 (six) AM. 10/31/18   [place], Burlene Arnthristopher T, MD  lisdexamfetamine (VYVANSE) 30 MG capsule Take 1 capsule by mouth daily. 10/16/21   [provider]  pregabalin (LYRICA) 50 MG capsule Take 1 mg by mouth 2 (two) times daily. 11/14/21   [provider]       Allergies    Sulfa antibiotics    Review of Systems   Review of Systems  Constitutional:  Negative for fever.  Respiratory:  Negative for shortness of breath.   Cardiovascular:  Negative for chest pain.  Gastrointestinal:  Negative for abdominal pain, diarrhea, nausea and vomiting.  Musculoskeletal:  Positive for arthralgias.       Left-sided shoulder pain.  All other systems reviewed and are negative.  Physical Exam Updated Vital Signs BP (!) 152/96 (BP Location: Right Arm)    Pulse (!) 116    Temp 97.9 F (36.6 C) (Oral)    Resp 16    SpO2 96%  Physical Exam Vitals and nursing note reviewed.  Constitutional:      General: She is not in acute distress.    Appearance: Normal appearance. She is not ill-appearing, toxic-appearing or diaphoretic.  HENT:     Head: Normocephalic and atraumatic.     Nose: Nose normal.     Mouth/Throat:     Mouth: Mucous membranes are moist.  Eyes:     Extraocular Movements: Extraocular movements intact.     Pupils: Pupils are equal, round, and reactive to light.  Cardiovascular:     Rate and Rhythm: Tachycardia present.  Pulmonary:     Effort: Pulmonary effort is normal.     Breath sounds: Normal breath sounds. No wheezing.  Abdominal:     General: Abdomen is flat. There is no distension.     Palpations: Abdomen is soft. There is  no mass.     Tenderness: There is no abdominal tenderness. There is no guarding.  Musculoskeletal:        General: Tenderness present.     Right shoulder: Normal.     Left shoulder: Swelling and tenderness present. No deformity, effusion or laceration. Decreased range of motion.     Comments: Patient unable to extend arm behind her back.  Skin:    General: Skin is warm and dry.     Capillary Refill: Capillary refill takes less than 2 seconds.  Neurological:     General: No focal deficit present.     Mental Status: She is alert and oriented to person, place, and time. Mental status is at baseline.    ED  Results / Procedures / Treatments   Labs (all labs ordered are listed, but only abnormal results are displayed) Labs Reviewed - No data to display  EKG None  Radiology DG Shoulder Left  Result Date: 12/28/2021 CLINICAL DATA:  Left shoulder pain radiating to the left arm for 2 weeks. No injury. EXAM: LEFT SHOULDER - 2+ VIEW COMPARISON:  None. FINDINGS: There is no evidence of fracture or dislocation. There is no evidence of arthropathy or other focal bone abnormality. Soft tissues are unremarkable. IMPRESSION: Negative. Electronically Signed   By: Sherian Rein M.D.   On: 12/28/2021 13:11    Procedures Procedures    Medications Ordered in ED Medications  lidocaine (LIDODERM) 5 % 1 patch (has no administration in time range)    ED Course/ Medical Decision Making/ A&P                           Medical Decision Making Amount and/or Complexity of Data Reviewed Radiology: ordered.  Risk Prescription drug management.   56 year old female presents due to left-sided shoulder pain for the last 2 weeks.  Patient states the pain began acutely when she was reaching behind her back to clasp her bra.  On examination, patient has tenderness to palpation of the left shoulder along with decreased range of motion.  Patient is unable to extend shoulder behind her back.  When patient attempts to reach shoulder behind back, patient complains of pain.  Patient is neurovascularly intact in affected appendage on left side.  Patient has 2+ radial pulse to the left wrist.  I believe that the cause of this patient's pain is musculoskeletal in nature.  However, due to the atypical presentation of females with chest pain, I will evaluate a troponin for this patient to ensure that there is no elevation.  Patient denies any radiation of pain to the left arm or into jaw, however I will still evaluate this.  Patient x-ray of left shoulder shows no signs of fracture or other abnormalities.  Patient EKG shows  sinus tachycardia.  On reevaluation of patient vital signs, pulse rate has decreased to 88.  I will treat the patient's pain here with a Lidoderm patch.  Patient troponin result <2.  Patient reports improvement of pain after application of Lidoderm patch.  At this point, I feel comfortable diagnosing this patient's pain is musculoskeletal in nature.  I provided her with an orthopedic follow-up outpatient and provided her with return precautions which she voices understanding with.  I have advised the patient to treat her pain utilizing ibuprofen and Tylenol and I have also instructed her to utilize heat pads and ice packs.  All the patient's questions were answered to her satisfaction.  The  patient is stable on discharge.   Final Clinical Impression(s) / ED Diagnoses Final diagnoses:  Strain of left shoulder, initial encounter    Rx / DC Orders ED Discharge Orders     None         Al Decant, PA-C 12/28/21 1656    Vanetta Mulders, MD 12/30/21 [phone]

## 2021-12-28 NOTE — ED Triage Notes (Signed)
Left shoulder pain radiating into left arm, worse with movement x 2 weeks

## 2022-01-09 LAB — THYROGLOBULIN ANTIBODY: Thyroglobulin Antibody: 1.5 IU/mL — ABNORMAL HIGH (ref 0.0–0.9)

## 2022-01-09 LAB — THYROID PEROXIDASE ANTIBODY: Thyroperoxidase Ab SerPl-aCnc: 15 IU/mL (ref 0–34)

## 2022-01-09 LAB — TSH: TSH: 3.53 u[IU]/mL (ref 0.450–4.500)

## 2022-01-09 LAB — T4, FREE: Free T4: 1.43 ng/dL (ref 0.82–1.77)

## 2022-01-10 ENCOUNTER — Ambulatory Visit: Payer: Medicare Other | Admitting: "Endocrinology

## 2022-01-28 ENCOUNTER — Ambulatory Visit: Payer: Medicare Other | Admitting: "Endocrinology

## 2022-01-29 ENCOUNTER — Encounter: Payer: Self-pay | Admitting: Orthopedic Surgery

## 2022-01-29 ENCOUNTER — Other Ambulatory Visit: Payer: Self-pay

## 2022-01-29 ENCOUNTER — Ambulatory Visit: Payer: Medicare Other

## 2022-01-29 ENCOUNTER — Ambulatory Visit (INDEPENDENT_AMBULATORY_CARE_PROVIDER_SITE_OTHER): Payer: Medicare Other | Admitting: Orthopedic Surgery

## 2022-01-29 ENCOUNTER — Ambulatory Visit (INDEPENDENT_AMBULATORY_CARE_PROVIDER_SITE_OTHER): Payer: Medicare Other | Admitting: "Endocrinology

## 2022-01-29 ENCOUNTER — Encounter: Payer: Self-pay | Admitting: "Endocrinology

## 2022-01-29 VITALS — BP 124/80 | HR 84 | Ht 64.0 in | Wt 165.6 lb

## 2022-01-29 VITALS — BP 140/90 | HR 100 | Ht 64.25 in | Wt 165.0 lb

## 2022-01-29 DIAGNOSIS — E890 Postprocedural hypothyroidism: Secondary | ICD-10-CM | POA: Diagnosis not present

## 2022-01-29 DIAGNOSIS — M25512 Pain in left shoulder: Secondary | ICD-10-CM | POA: Diagnosis not present

## 2022-01-29 DIAGNOSIS — M25511 Pain in right shoulder: Secondary | ICD-10-CM

## 2022-01-29 MED ORDER — LEVOTHYROXINE SODIUM 88 MCG PO TABS: 88.0000 ug | ORAL_TABLET | Freq: Every day | ORAL | 1 refills | Status: DC

## 2022-01-29 NOTE — Patient Instructions (Signed)
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
## 2022-07-29 ENCOUNTER — Ambulatory Visit: Payer: Medicare Other | Admitting: "Endocrinology

## 2022-09-06 ENCOUNTER — Other Ambulatory Visit: Payer: Self-pay | Admitting: "Endocrinology

## 2022-10-02 LAB — BASIC METABOLIC PANEL
BUN: 10 (ref 4–21)
CO2: 27 — AB (ref 13–22)
Chloride: 97 — AB (ref 99–108)
Creatinine: 0.8 (ref 0.5–1.1)
Glucose: 115
Potassium: 4.3 mEq/L (ref 3.5–5.1)
Sodium: 136 — AB (ref 137–147)

## 2022-10-02 LAB — CBC AND DIFFERENTIAL
HCT: 43 (ref 36–46)
Hemoglobin: 14.2 (ref 12.0–16.0)
Platelets: 328 10*3/uL (ref 150–400)
WBC: 8.8

## 2022-10-02 LAB — COMPREHENSIVE METABOLIC PANEL
Albumin: 4.1 (ref 3.5–5.0)
Calcium: 9.1 (ref 8.7–10.7)
Globulin: 3.1

## 2022-10-02 LAB — HEPATIC FUNCTION PANEL
ALT: 40 U/L — AB (ref 7–35)
AST: 38 — AB (ref 13–35)
Alkaline Phosphatase: 106 (ref 25–125)
Bilirubin, Total: 0.4

## 2022-10-02 LAB — TSH: TSH: 6.91 — AB (ref 0.41–5.90)

## 2022-10-02 LAB — CBC: RBC: 5.15 — AB (ref 3.87–5.11)

## 2022-10-08 ENCOUNTER — Encounter: Payer: Self-pay | Admitting: "Endocrinology

## 2022-10-24 ENCOUNTER — Ambulatory Visit: Payer: Self-pay | Admitting: Obstetrics & Gynecology

## 2022-10-24 DIAGNOSIS — Z539 Procedure and treatment not carried out, unspecified reason: Secondary | ICD-10-CM

## 2022-11-12 ENCOUNTER — Ambulatory Visit: Payer: Self-pay | Admitting: Obstetrics & Gynecology

## 2023-02-06 ENCOUNTER — Encounter: Payer: Self-pay | Admitting: Radiology

## 2023-04-13 ENCOUNTER — Other Ambulatory Visit: Payer: Self-pay | Admitting: "Endocrinology

## 2023-05-14 ENCOUNTER — Other Ambulatory Visit: Payer: Self-pay | Admitting: "Endocrinology

## 2023-05-22 ENCOUNTER — Telehealth: Payer: Self-pay | Admitting: "Endocrinology

## 2023-05-22 DIAGNOSIS — E890 Postprocedural hypothyroidism: Secondary | ICD-10-CM

## 2023-05-22 NOTE — Telephone Encounter (Signed)
Pt is requesting refill of levothyroxine.  Has appt now scheduled for 08/18/23 at 3.  She will also need her lab order updated.

## 2023-05-23 MED ORDER — LEVOTHYROXINE SODIUM 88 MCG PO TABS: ORAL_TABLET | ORAL | 0 refills | Status: DC

## 2023-05-23 NOTE — Telephone Encounter (Signed)
Rx refill sent to pharmacy and labs updated.

## 2023-08-18 ENCOUNTER — Ambulatory Visit: Payer: 59 | Admitting: "Endocrinology

## 2023-09-29 ENCOUNTER — Telehealth: Payer: Self-pay | Admitting: "Endocrinology

## 2023-09-29 ENCOUNTER — Other Ambulatory Visit: Payer: Self-pay | Admitting: "Endocrinology

## 2023-09-29 DIAGNOSIS — E890 Postprocedural hypothyroidism: Secondary | ICD-10-CM

## 2023-09-29 MED ORDER — LEVOTHYROXINE SODIUM 88 MCG PO TABS: ORAL_TABLET | ORAL | 0 refills | Status: DC

## 2023-09-29 NOTE — Telephone Encounter (Signed)
Pt made appt for 10/17/23 but has not been seen since 01/29/22. Pt is asking for a refill on Levothyroxine. Has not had any new labs done.

## 2023-09-29 NOTE — Telephone Encounter (Signed)
Pt is requesting a refill on her Levothyroxine to be sent to [facility]

## 2023-10-17 ENCOUNTER — Ambulatory Visit: Payer: 59 | Admitting: "Endocrinology

## 2023-11-18 ENCOUNTER — Other Ambulatory Visit: Payer: Self-pay | Admitting: "Endocrinology

## 2023-11-18 DIAGNOSIS — E890 Postprocedural hypothyroidism: Secondary | ICD-10-CM

## 2023-11-24 ENCOUNTER — Other Ambulatory Visit: Payer: Self-pay | Admitting: "Endocrinology

## 2023-11-24 ENCOUNTER — Telehealth: Payer: Self-pay | Admitting: "Endocrinology

## 2023-11-24 DIAGNOSIS — E890 Postprocedural hypothyroidism: Secondary | ICD-10-CM

## 2023-11-24 MED ORDER — LEVOTHYROXINE SODIUM 88 MCG PO TABS: ORAL_TABLET | ORAL | 0 refills | Status: AC

## 2023-11-24 NOTE — Telephone Encounter (Addendum)
Patient is asking for a refill on her thyroid medication to be sent to [facility]. Last OV 2/23. She was given 30 tablets in October to get her to her November appointment, which she canceled...Marland Kitchennext appointment is for 02/10/24

## 2023-11-25 NOTE — Telephone Encounter (Signed)
Tried to call pt but call will not go through. Will try again tomorrow.

## 2023-11-26 NOTE — Telephone Encounter (Signed)
Tried to call pt on her cell phone number listed, call would not go through.

## 2023-11-27 NOTE — Telephone Encounter (Signed)
Tried to contact pt multiple times at telephone numbers listed, phone call will not go through on either number.

## 2023-12-16 ENCOUNTER — Encounter: Payer: Self-pay | Admitting: Internal Medicine

## 2023-12-17 ENCOUNTER — Encounter: Payer: Self-pay | Admitting: Internal Medicine

## 2023-12-19 ENCOUNTER — Encounter: Payer: Self-pay | Admitting: Internal Medicine

## 2024-01-01 ENCOUNTER — Ambulatory Visit: Payer: 59 | Admitting: Internal Medicine

## 2024-01-08 ENCOUNTER — Ambulatory Visit: Payer: 59 | Admitting: Internal Medicine

## 2024-01-15 ENCOUNTER — Other Ambulatory Visit: Payer: Self-pay | Admitting: *Deleted

## 2024-01-15 ENCOUNTER — Encounter: Payer: Self-pay | Admitting: Internal Medicine

## 2024-01-15 ENCOUNTER — Encounter: Payer: Self-pay | Admitting: *Deleted

## 2024-01-15 ENCOUNTER — Ambulatory Visit (INDEPENDENT_AMBULATORY_CARE_PROVIDER_SITE_OTHER): Payer: 59 | Admitting: Internal Medicine

## 2024-01-15 VITALS — BP 133/87 | HR 98 | Temp 98.5°F | Ht 65.0 in | Wt 170.6 lb

## 2024-01-15 DIAGNOSIS — R7989 Other specified abnormal findings of blood chemistry: Secondary | ICD-10-CM | POA: Diagnosis not present

## 2024-01-15 DIAGNOSIS — Z1211 Encounter for screening for malignant neoplasm of colon: Secondary | ICD-10-CM

## 2024-01-15 DIAGNOSIS — K5904 Chronic idiopathic constipation: Secondary | ICD-10-CM

## 2024-01-15 DIAGNOSIS — B182 Chronic viral hepatitis C: Secondary | ICD-10-CM

## 2024-01-15 DIAGNOSIS — K5909 Other constipation: Secondary | ICD-10-CM | POA: Diagnosis not present

## 2024-01-15 MED ORDER — PEG 3350-KCL-NA BICARB-NACL 420 G PO SOLR: 4000.0000 mL | Freq: Once | ORAL | 0 refills | Status: AC
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
## 2024-01-15 NOTE — Patient Instructions (Signed)
 For your chronic hepatitis C, I am going to check blood work at [facility].  I am also going to order an ultrasound of your liver.  Once we have these results, we will start the process of getting you treated.  We will schedule you for colonoscopy today for colon cancer screening purposes.  For your constipation, I want you to start taking over the counter Colace 1-2 times daily.   Be sure to drink at least 6 glasses of water daily.   It was very nice meeting you today.  Dr. Cindie

## 2024-01-16 ENCOUNTER — Encounter: Payer: Self-pay | Admitting: Internal Medicine

## 2024-01-21 ENCOUNTER — Ambulatory Visit (HOSPITAL_COMMUNITY)
Admission: RE | Admit: 2024-01-21 | Discharge: 2024-01-21 | Disposition: A | Discharge status: Home / Self Care | Payer: 59 | Source: Ambulatory Visit | Attending: Internal Medicine | Admitting: Internal Medicine

## 2024-01-21 ENCOUNTER — Other Ambulatory Visit: Payer: Self-pay | Admitting: Internal Medicine

## 2024-01-21 DIAGNOSIS — B182 Chronic viral hepatitis C: Secondary | ICD-10-CM | POA: Insufficient documentation

## 2024-01-21 DIAGNOSIS — K5904 Chronic idiopathic constipation: Secondary | ICD-10-CM

## 2024-01-21 DIAGNOSIS — Z1211 Encounter for screening for malignant neoplasm of colon: Secondary | ICD-10-CM

## 2024-01-21 DIAGNOSIS — R7989 Other specified abnormal findings of blood chemistry: Secondary | ICD-10-CM

## 2024-01-24 LAB — HIV ANTIBODY (ROUTINE TESTING W REFLEX): HIV Screen 4th Generation wRfx: NONREACTIVE

## 2024-01-24 LAB — HEPATITIS B SURFACE ANTIBODY,QUALITATIVE: Hep B Surface Ab, Qual: NONREACTIVE

## 2024-01-24 LAB — HEPATITIS C GENOTYPE

## 2024-01-29 ENCOUNTER — Telehealth: Payer: Self-pay | Admitting: "Endocrinology

## 2024-01-29 DIAGNOSIS — E890 Postprocedural hypothyroidism: Secondary | ICD-10-CM

## 2024-01-29 NOTE — Telephone Encounter (Signed)
 Labs updated and sent to [facility].

## 2024-01-29 NOTE — Telephone Encounter (Signed)
 Pt needs labs updated

## 2024-02-02 NOTE — Anesthesia Preprocedure Evaluation (Signed)
 Anesthesia Evaluation  Patient identified by MRN, date of birth, ID band Patient awake    Reviewed: Allergy & Precautions, NPO status , Patient's Chart, lab work & pertinent test results  Airway Mallampati: II  TM Distance: >3 FB     Dental no notable dental hx. (+) Teeth Intact, Dental Advisory Given   Pulmonary Current Smoker   Pulmonary exam normal breath sounds clear to auscultation       Cardiovascular negative cardio ROS Normal cardiovascular exam Rhythm:Regular Rate:Normal     Neuro/Psych  PSYCHIATRIC DISORDERS Anxiety Depression Bipolar Disorder   sciatica  Neuromuscular disease    GI/Hepatic negative GI ROS, Neg liver ROS,,,  Endo/Other  Hypothyroidism    Renal/GU negative Renal ROS     Musculoskeletal   Abdominal   Peds  Hematology   Anesthesia Other Findings   Reproductive/Obstetrics                             Anesthesia Physical Anesthesia Plan  ASA: 2  Anesthesia Plan: General   Post-op Pain Management: Minimal or no pain anticipated   Induction: Intravenous  PONV Risk Score and Plan: Propofol infusion  Airway Management Planned: Nasal Cannula and Natural Airway  Additional Equipment: None  Intra-op Plan:   Post-operative Plan:   Informed Consent: I have reviewed the patients History and Physical, chart, labs and discussed the procedure including the risks, benefits and alternatives for the proposed anesthesia with the patient or authorized representative who has indicated his/her understanding and acceptance.     Dental advisory given  Plan Discussed with: CRNA, Anesthesiologist and Surgeon  Anesthesia Plan Comments:         Anesthesia Quick Evaluation

## 2024-02-03 ENCOUNTER — Encounter (HOSPITAL_COMMUNITY)
Admission: RE | Disposition: A | Discharge status: Home / Self Care | Payer: Self-pay | Source: Home / Self Care | Attending: Internal Medicine

## 2024-02-03 ENCOUNTER — Ambulatory Visit (HOSPITAL_COMMUNITY)
Admission: RE | Admit: 2024-02-03 | Discharge: 2024-02-03 | Disposition: A | Discharge status: Home / Self Care | Payer: 59 | Attending: Internal Medicine | Admitting: Internal Medicine

## 2024-02-03 ENCOUNTER — Other Ambulatory Visit: Payer: Self-pay

## 2024-02-03 ENCOUNTER — Encounter (HOSPITAL_COMMUNITY): Payer: Self-pay | Admitting: Internal Medicine

## 2024-02-03 ENCOUNTER — Ambulatory Visit (HOSPITAL_BASED_OUTPATIENT_CLINIC_OR_DEPARTMENT_OTHER): Payer: 59 | Admitting: Anesthesiology

## 2024-02-03 ENCOUNTER — Ambulatory Visit (HOSPITAL_COMMUNITY): Payer: 59 | Admitting: Anesthesiology

## 2024-02-03 DIAGNOSIS — Z1211 Encounter for screening for malignant neoplasm of colon: Secondary | ICD-10-CM | POA: Diagnosis not present

## 2024-02-03 DIAGNOSIS — E039 Hypothyroidism, unspecified: Secondary | ICD-10-CM | POA: Diagnosis not present

## 2024-02-03 DIAGNOSIS — K5909 Other constipation: Secondary | ICD-10-CM | POA: Insufficient documentation

## 2024-02-03 DIAGNOSIS — B182 Chronic viral hepatitis C: Secondary | ICD-10-CM | POA: Insufficient documentation

## 2024-02-03 DIAGNOSIS — F419 Anxiety disorder, unspecified: Secondary | ICD-10-CM | POA: Insufficient documentation

## 2024-02-03 DIAGNOSIS — F319 Bipolar disorder, unspecified: Secondary | ICD-10-CM | POA: Insufficient documentation

## 2024-02-03 DIAGNOSIS — M5430 Sciatica, unspecified side: Secondary | ICD-10-CM | POA: Diagnosis not present

## 2024-02-03 DIAGNOSIS — G709 Myoneural disorder, unspecified: Secondary | ICD-10-CM | POA: Diagnosis not present

## 2024-02-03 DIAGNOSIS — F17210 Nicotine dependence, cigarettes, uncomplicated: Secondary | ICD-10-CM | POA: Diagnosis not present

## 2024-02-03 HISTORY — PX: COLONOSCOPY WITH PROPOFOL: SHX5780

## 2024-02-03 SURGERY — COLONOSCOPY WITH PROPOFOL
Anesthesia: General

## 2024-02-03 MED ORDER — SODIUM CHLORIDE 0.9% FLUSH: 3.0000 mL | Freq: Two times a day (BID) | INTRAVENOUS | Status: DC

## 2024-02-03 MED ORDER — SODIUM CHLORIDE 0.9% FLUSH: 3.0000 mL | INTRAVENOUS | Status: DC | PRN

## 2024-02-03 MED ORDER — LACTATED RINGERS IV SOLN
INTRAVENOUS | Status: DC | PRN
Start: 2024-02-03 — End: 2024-02-03

## 2024-02-03 MED ORDER — PROPOFOL 10 MG/ML IV BOLUS
INTRAVENOUS | Status: DC | PRN
  Administered 2024-02-03 (×2): 100 mg via INTRAVENOUS

## 2024-02-03 MED ORDER — LIDOCAINE HCL (CARDIAC) PF 100 MG/5ML IV SOSY
PREFILLED_SYRINGE | INTRAVENOUS | Status: DC | PRN
  Administered 2024-02-03: 50 mg via INTRATRACHEAL

## 2024-02-03 MED ORDER — EPHEDRINE SULFATE-NACL 50-0.9 MG/10ML-% IV SOSY
PREFILLED_SYRINGE | INTRAVENOUS | Status: DC | PRN
  Administered 2024-02-03: 10 mg via INTRAVENOUS

## 2024-02-03 MED ORDER — PROPOFOL 500 MG/50ML IV EMUL
INTRAVENOUS | Status: DC | PRN
  Administered 2024-02-03: 150 ug/kg/min via INTRAVENOUS

## 2024-02-03 NOTE — Transfer of Care (Signed)
 Immediate Anesthesia Transfer of Care Note  Patient: Loretta [name]  Procedure(s) Performed: COLONOSCOPY WITH PROPOFOL  Patient Location: Endoscopy Unit  Anesthesia Type:General  Level of Consciousness: awake  Airway & Oxygen Therapy: Patient Spontanous Breathing  Post-op Assessment: Report given to RN  Post vital signs: Reviewed and stable  Last Vitals:  Vitals Value Taken Time  BP    Temp    Pulse    Resp    SpO2      Last Pain:  Vitals:   02/03/24 0740  TempSrc:   PainSc: 0-No pain      Patients Stated Pain Goal: 4 (02/03/24 0653)  Complications: No notable events documented.

## 2024-02-03 NOTE — Interval H&P Note (Signed)
 History and Physical Interval Note:  02/03/2024 7:33 AM  Loretta [name]  has presented today for surgery, with the diagnosis of screening.  The various methods of treatment have been discussed with the patient and family. After consideration of risks, benefits and other options for treatment, the patient has consented to  Procedure(s) with comments: COLONOSCOPY WITH PROPOFOL (N/A) - 2:15 pm, asa 2, pt knows to arrive at 11:30 as a surgical intervention.  The patient's history has been reviewed, patient examined, no change in status, stable for surgery.  I have reviewed the patient's chart and labs.  Questions were answered to the patient's satisfaction.     [name]

## 2024-02-03 NOTE — Op Note (Signed)
 [facility] Patient Name: Loretta [name] Procedure Date: 02/03/2024 7:14 AM MRN: [id number] Date of Birth: [birth year] Attending MD: Hennie Duos. Marletta Lor , [place], [id number] CSN: [id number] Age: 58 Admit Type: Outpatient Procedure:                Colonoscopy Indications:              Screening for colorectal malignant neoplasm Providers:                Hennie Duos. Marletta Lor, DO, Angelica Ran, Kristine L.                            Jessee Avers, Technician Referring MD:              Medicines:                See the Anesthesia note for documentation of the                            administered medications Complications:            No immediate complications. Estimated Blood Loss:     Estimated blood loss: none. Procedure:                Pre-Anesthesia Assessment:                           - The anesthesia plan was to use monitored                            anesthesia care (MAC).                           After obtaining informed consent, the colonoscope                            was passed under direct vision. Throughout the                            procedure, the patient's blood pressure, pulse, and                            oxygen saturations were monitored continuously. The                            [id number]-HQ190L ([id number]) scope was introduced through                            the anus and advanced to the the cecum, identified                            by appendiceal orifice and ileocecal valve. The                            colonoscopy was performed without difficulty. The                            patient tolerated the procedure well.  The quality                            of the bowel preparation was evaluated using the                            BBPS [facility] Bowel Preparation Scale) with scores                            of: Right Colon = 1 (portion of mucosa seen, but                            other areas not well seen due to staining, residual                            stool  and/or opaque liquid), Transverse Colon = 1                            (portion of mucosa seen, but other areas not well                            seen due to staining, residual stool and/or opaque                            liquid) and Left Colon = 2 (minor amount of                            residual staining, small fragments of stool and/or                            opaque liquid, but mucosa seen well). The total                            BBPS score equals 4. The quality of the bowel                            preparation was inadequate. Scope In: 7:44:27 AM Scope Out: 7:50:21 AM Scope Withdrawal Time: 0 hours 0 minutes 46 seconds  Total Procedure Duration: 0 hours 5 minutes 54 seconds  Findings:      Extensive amounts of semi-solid and solid stool was found in the entire       colon, precluding visualization. Impression:               - Preparation of the colon was inadequate.                           - Stool in the entire examined colon.                           - No specimens collected. Moderate Sedation:      Per Anesthesia Care Recommendation:           - Patient has a contact number available for  emergencies. The signs and symptoms of potential                            delayed complications were discussed with the                            patient. Return to normal activities tomorrow.                            Written discharge instructions were provided to the                            patient.                           - Resume previous diet.                           - Continue present medications.                           - Repeat colonoscopy in 3 months because the bowel                            preparation was poor. Procedure Code(s):        --- Professional ---                           Z6109, Colorectal cancer screening; colonoscopy on                            individual not meeting criteria for high risk Diagnosis Code(s):         --- Professional ---                           Z12.11, Encounter for screening for malignant                            neoplasm of colon CPT copyright 2022 [employer] Medical [employer]. All rights reserved. The codes documented in this report are preliminary and upon coder review may  be revised to meet current compliance requirements. Hennie Duos. Marletta Lor, DO Hennie Duos. Marletta Lor, DO 02/03/2024 7:54:03 AM This report has been signed electronically. Number of Addenda: 0

## 2024-02-03 NOTE — Anesthesia Postprocedure Evaluation (Signed)
 Anesthesia Post Note  Patient: AHNNA DUNGAN  Procedure(s) Performed: COLONOSCOPY WITH PROPOFOL  Patient location during evaluation: Endoscopy Anesthesia Type: General Level of consciousness: awake and alert Pain management: pain level controlled Vital Signs Assessment: post-procedure vital signs reviewed and stable Respiratory status: spontaneous breathing Cardiovascular status: blood pressure returned to baseline and stable Postop Assessment: no apparent nausea or vomiting Anesthetic complications: no   No notable events documented.   Last Vitals:  Vitals:   02/03/24 0753 02/03/24 0758  BP: (!) 73/44 (!) 94/58  Pulse: 72 74  Resp: (!) 8 (!) 9  Temp: 36.8 C   SpO2: 94% 94%    Last Pain:  Vitals:   02/03/24 0758  TempSrc:   PainSc: 0-No pain                 Julian Medina

## 2024-02-03 NOTE — Discharge Instructions (Addendum)
  Colonoscopy Discharge Instructions  Read the instructions outlined below and refer to this sheet in the next few weeks. These discharge instructions provide you with general information on caring for yourself after you leave the hospital. Your doctor may also give you specific instructions. While your treatment has been planned according to the most current medical practices available, unavoidable complications occasionally occur.   ACTIVITY You may resume your regular activity, but move at a slower pace for the next 24 hours.  Take frequent rest periods for the next 24 hours.  Walking will help get rid of the air and reduce the bloated feeling in your belly (abdomen).  No driving for 24 hours (because of the medicine (anesthesia) used during the test).   Do not sign any important legal documents or operate any machinery for 24 hours (because of the anesthesia used during the test).  NUTRITION Drink plenty of fluids.  You may resume your normal diet as instructed by your doctor.  Begin with a light meal and progress to your normal diet. Heavy or fried foods are harder to digest and may make you feel sick to your stomach (nauseated).  Avoid alcoholic beverages for 24 hours or as instructed.  MEDICATIONS You may resume your normal medications unless your doctor tells you otherwise.  WHAT YOU CAN EXPECT TODAY Some feelings of bloating in the abdomen.  Passage of more gas than usual.  Spotting of blood in your stool or on the toilet paper.  IF YOU HAD POLYPS REMOVED DURING THE COLONOSCOPY: No aspirin products for 7 days or as instructed.  No alcohol for 7 days or as instructed.  Eat a soft diet for the next 24 hours.  FINDING OUT THE RESULTS OF YOUR TEST Not all test results are available during your visit. If your test results are not back during the visit, make an appointment with your caregiver to find out the results. Do not assume everything is normal if you have not heard from your  caregiver or the medical facility. It is important for you to follow up on all of your test results.  SEEK IMMEDIATE MEDICAL ATTENTION IF: You have more than a spotting of blood in your stool.  Your belly is swollen (abdominal distention).  You are nauseated or vomiting.  You have a temperature over 101.  You have abdominal pain or discomfort that is severe or gets worse throughout the day.   Unfortunately, your colon was not adequately prepped today for colonoscopy.  I did not see any evidence of colon cancer or large polyps, but certainly could have missed smaller polyps due to poor visualization.  Would recommend repeat colonoscopy in 3-6 months with a different colon prep.   I hope you have a great rest of your week!  Charles K. Carver, D.O. Gastroenterology and Hepatology [facility]  

## 2024-02-05 ENCOUNTER — Encounter (HOSPITAL_COMMUNITY): Payer: Self-pay | Admitting: Internal Medicine

## 2024-02-05 ENCOUNTER — Other Ambulatory Visit: Payer: Self-pay | Admitting: Internal Medicine

## 2024-02-05 MED ORDER — MAVYRET 100-40 MG PO TABS: 3.0000 | ORAL_TABLET | Freq: Every day | ORAL | 1 refills | Status: AC

## 2024-02-06 ENCOUNTER — Other Ambulatory Visit: Payer: Self-pay | Admitting: Internal Medicine

## 2024-02-06 NOTE — Telephone Encounter (Signed)
 Notes have already been faxed X 2 to [facility] for [phone]

## 2024-02-10 ENCOUNTER — Encounter: Payer: Self-pay | Admitting: "Endocrinology

## 2024-02-10 ENCOUNTER — Ambulatory Visit (INDEPENDENT_AMBULATORY_CARE_PROVIDER_SITE_OTHER): Payer: 59 | Admitting: "Endocrinology

## 2024-02-10 VITALS — BP 130/82 | HR 72 | Ht 65.0 in | Wt 168.6 lb

## 2024-02-10 DIAGNOSIS — E663 Overweight: Secondary | ICD-10-CM | POA: Diagnosis not present

## 2024-02-10 DIAGNOSIS — F17200 Nicotine dependence, unspecified, uncomplicated: Secondary | ICD-10-CM

## 2024-02-10 DIAGNOSIS — E890 Postprocedural hypothyroidism: Secondary | ICD-10-CM | POA: Diagnosis not present

## 2024-02-10 LAB — TSH: TSH: 2.1 u[IU]/mL (ref 0.450–4.500)

## 2024-02-10 LAB — T4, FREE: Free T4: 1.32 ng/dL (ref 0.82–1.77)
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
## 2024-03-15 ENCOUNTER — Other Ambulatory Visit: Payer: Self-pay

## 2024-04-07 ENCOUNTER — Other Ambulatory Visit: Payer: Self-pay | Admitting: Internal Medicine

## 2024-04-07 MED ORDER — ONDANSETRON HCL 4 MG PO TABS: 4.0000 mg | ORAL_TABLET | Freq: Three times a day (TID) | ORAL | 1 refills | Status: DC | PRN

## 2024-05-19 ENCOUNTER — Telehealth: Payer: Self-pay

## 2024-05-19 ENCOUNTER — Other Ambulatory Visit: Payer: Self-pay

## 2024-05-19 DIAGNOSIS — R7989 Other specified abnormal findings of blood chemistry: Secondary | ICD-10-CM

## 2024-05-19 DIAGNOSIS — B182 Chronic viral hepatitis C: Secondary | ICD-10-CM

## 2024-05-19 NOTE — Telephone Encounter (Signed)
 Pt phoned yesterday wanting to know what labs will be needed for her upcoming appt with Dr Goble Last.  Contacted Dr Mordechai April and was advised to order a CBC, CMP, HEP. C, RNA.   Labs ordered.

## 2024-05-19 NOTE — Telephone Encounter (Signed)
 Phoned the pt and LMOVM to return call regarding her labs.  Labs are done and in the system.

## 2024-06-24 ENCOUNTER — Ambulatory Visit: Admitting: Internal Medicine
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
## 2024-07-07 ENCOUNTER — Ambulatory Visit: Admitting: Gastroenterology

## 2024-07-07 NOTE — Telephone Encounter (Signed)
 Returned the pt's call advised of labs to be drawn @ [facility]. I advised her I had phoned her 05/19/2024 and LMOVM but she stated he didn't check it. I asked her when did she finish her medication she said around June 3rd

## 2024-07-28 ENCOUNTER — Ambulatory Visit: Admitting: Gastroenterology

## 2024-07-28 LAB — HCV RNA QUANT
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
## 2024-07-29 LAB — CBC WITH DIFFERENTIAL/PLATELET
Basophils Absolute: 0.1 x10E3/uL (ref 0.0–0.2)
Basos: 1 %
EOS (ABSOLUTE): 0.3 x10E3/uL (ref 0.0–0.4)
Eos: 4 %
Hematocrit: 43.5 % (ref 34.0–46.6)
Hemoglobin: 13.9 g/dL (ref 11.1–15.9)
Immature Grans (Abs): 0 x10E3/uL (ref 0.0–0.1)
Immature Granulocytes: 0 %
Lymphocytes Absolute: 3.2 x10E3/uL — ABNORMAL HIGH (ref 0.7–3.1)
Lymphs: 37 %
MCH: 28 pg (ref 26.6–33.0)
MCHC: 32 g/dL (ref 31.5–35.7)
MCV: 88 fL (ref 79–97)
Monocytes Absolute: 0.9 x10E3/uL (ref 0.1–0.9)
Monocytes: 11 %
Neutrophils Absolute: 4 x10E3/uL (ref 1.4–7.0)
Neutrophils: 46 %
Platelets: 337 x10E3/uL (ref 150–450)
RBC: 4.97 x10E6/uL (ref 3.77–5.28)
RDW: 13 % (ref 11.7–15.4)
WBC: 8.5 x10E3/uL (ref 3.4–10.8)

## 2024-07-29 LAB — COMPREHENSIVE METABOLIC PANEL WITH GFR
ALT: 14 IU/L (ref 0–32)
AST: 17 IU/L (ref 0–40)
Albumin: 4.2 g/dL (ref 3.8–4.9)
Alkaline Phosphatase: 106 IU/L (ref 44–121)
BUN/Creatinine Ratio: 15 (ref 9–23)
BUN: 12 mg/dL (ref 6–24)
Bilirubin Total: 0.4 mg/dL (ref 0.0–1.2)
CO2: 26 mmol/L (ref 20–29)
Calcium: 9.5 mg/dL (ref 8.7–10.2)
Chloride: 100 mmol/L (ref 96–106)
Creatinine, Ser: 0.78 mg/dL (ref 0.57–1.00)
Globulin, Total: 3 g/dL (ref 1.5–4.5)
Glucose: 79 mg/dL (ref 70–99)
Potassium: 5 mmol/L (ref 3.5–5.2)
Sodium: 139 mmol/L (ref 134–144)
Total Protein: 7.2 g/dL (ref 6.0–8.5)
eGFR: 89 mL/min/1.73 (ref >59)

## 2024-07-29 LAB — HCV RNA QUANT: Hepatitis C Quantitation: NOT DETECTED [IU]/mL

## 2024-08-03 ENCOUNTER — Ambulatory Visit: Payer: Self-pay | Admitting: Internal Medicine

## 2024-09-07 ENCOUNTER — Ambulatory Visit: Admitting: Pulmonary Disease

## 2024-09-08 ENCOUNTER — Ambulatory Visit: Admitting: Gastroenterology

## 2024-09-09 ENCOUNTER — Encounter: Payer: Self-pay | Admitting: Gastroenterology
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
## 2024-09-10 ENCOUNTER — Encounter: Payer: Self-pay | Admitting: Pulmonary Disease

## 2024-09-10 ENCOUNTER — Ambulatory Visit: Admitting: Pulmonary Disease
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
## 2024-10-11 ENCOUNTER — Ambulatory Visit (HOSPITAL_BASED_OUTPATIENT_CLINIC_OR_DEPARTMENT_OTHER): Admitting: Pulmonary Disease
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
## 2024-10-28 ENCOUNTER — Encounter (HOSPITAL_BASED_OUTPATIENT_CLINIC_OR_DEPARTMENT_OTHER): Payer: Self-pay | Admitting: Pulmonary Disease

## 2024-10-28 ENCOUNTER — Ambulatory Visit (INDEPENDENT_AMBULATORY_CARE_PROVIDER_SITE_OTHER): Admitting: Pulmonary Disease

## 2024-10-28 VITALS — BP 149/95 | HR 108 | Ht 65.0 in | Wt 169.0 lb

## 2024-10-28 DIAGNOSIS — R053 Chronic cough: Secondary | ICD-10-CM | POA: Diagnosis not present

## 2024-10-28 DIAGNOSIS — R911 Solitary pulmonary nodule: Secondary | ICD-10-CM

## 2024-10-28 DIAGNOSIS — G4730 Sleep apnea, unspecified: Secondary | ICD-10-CM

## 2024-10-28 DIAGNOSIS — F17210 Nicotine dependence, cigarettes, uncomplicated: Secondary | ICD-10-CM

## 2024-10-28 DIAGNOSIS — Z23 Encounter for immunization: Secondary | ICD-10-CM | POA: Diagnosis not present

## 2024-10-28 NOTE — Patient Instructions (Signed)
  VISIT SUMMARY: During your visit, we discussed your concerns about lung health and sleep disturbances. We addressed the pulmonary nodules found two years ago, suspected sleep apnea, nicotine  dependence, and your chronic cough and wheezing.  YOUR PLAN: PULMONARY NODULES: Two spots in your lungs were found two years ago, each about a centimeter in size. We need to evaluate these further to rule out any serious conditions. -We have ordered a chest CT scan to evaluate the pulmonary nodules. -We will calculate the risk of malignancy based on the CT findings.  SUSPECTED SLEEP APNEA: You have symptoms that suggest sleep apnea, such as snoring, stopping breathing during sleep, and daytime fatigue. Methadone use may also contribute to this condition. -We have ordered an in-lab sleep study to evaluate for sleep apnea.  NICOTINE  DEPENDENCE: You have a long history of smoking and want to quit. We discussed the challenges and strategies for quitting smoking. -We recommend using a nicotine  patch and lozenges to help you quit smoking. -Please contact 1-800-QUIT-NOW for additional support and resources.  CHRONIC COUGH AND INTERMITTENT WHEEZING: You have a persistent cough and wheezing, which may be related to your smoking history. We need to evaluate for conditions like COPD. -We have ordered a pulmonary function test to evaluate for COPD.   Contains text generated by [employer].

## 2024-11-25 ENCOUNTER — Ambulatory Visit (INDEPENDENT_AMBULATORY_CARE_PROVIDER_SITE_OTHER)

## 2024-11-25 DIAGNOSIS — R911 Solitary pulmonary nodule: Secondary | ICD-10-CM

## 2024-11-25 DIAGNOSIS — F17210 Nicotine dependence, cigarettes, uncomplicated: Secondary | ICD-10-CM

## 2024-11-25 LAB — PULMONARY FUNCTION TEST
DL/VA % pred: 91 %
DL/VA: 3.84 ml/min/mmHg/L
DLCO unc % pred: 84 %
DLCO unc: 18 ml/min/mmHg
FEF 25-75 Post: 2.62 L/s
FEF 25-75 Pre: 2.48 L/s
FEF2575-%Change-Post: 5 %
FEF2575-%Pred-Post: 101 %
FEF2575-%Pred-Pre: 96 %
FEV1-%Change-Post: 0 %
FEV1-%Pred-Post: 95 %
FEV1-%Pred-Pre: 95 %
FEV1-Post: 2.62 L
FEV1-Pre: 2.62 L
FEV1FVC-%Change-Post: 9 %
FEV1FVC-%Pred-Pre: 100 %
FEV6-%Change-Post: -9 %
FEV6-%Pred-Post: 87 %
FEV6-%Pred-Pre: 96 %
FEV6-Post: 2.99 L
FEV6-Pre: 3.3 L
FEV6FVC-%Pred-Post: 103 %
FEV6FVC-%Pred-Pre: 103 %
FVC-%Change-Post: -8 %
FVC-%Pred-Post: 85 %
FVC-%Pred-Pre: 93 %
FVC-Post: 3.01 L
FVC-Pre: 3.3 L
Post FEV1/FVC ratio: 87 %
Post FEV6/FVC ratio: 100 %
Pre FEV1/FVC ratio: 80 %
Pre FEV6/FVC Ratio: 100 %
RV % pred: 147 %
RV: 2.89 L
TLC % pred: 116 %
TLC: 6.06 L

## 2024-11-25 NOTE — Progress Notes (Signed)
 Full PFT performed today per Dr. Catherine.

## 2024-11-25 NOTE — Patient Instructions (Signed)
 Full PFT performed today per Dr. Catherine.

## 2024-11-26 ENCOUNTER — Other Ambulatory Visit (HOSPITAL_BASED_OUTPATIENT_CLINIC_OR_DEPARTMENT_OTHER): Payer: Self-pay

## 2024-11-26 ENCOUNTER — Ambulatory Visit (HOSPITAL_COMMUNITY)
Admission: RE | Admit: 2024-11-26 | Discharge: 2024-11-26 | Disposition: A | Discharge status: Home / Self Care | Source: Ambulatory Visit | Attending: Pulmonary Disease | Admitting: Pulmonary Disease

## 2024-11-26 ENCOUNTER — Ambulatory Visit: Payer: Self-pay | Admitting: Pulmonary Disease

## 2024-11-26 DIAGNOSIS — F17210 Nicotine dependence, cigarettes, uncomplicated: Secondary | ICD-10-CM | POA: Insufficient documentation

## 2024-11-26 DIAGNOSIS — R911 Solitary pulmonary nodule: Secondary | ICD-10-CM | POA: Insufficient documentation

## 2024-11-26 MED ORDER — CLONAZEPAM 1 MG PO TABS: 1.0000 mg | ORAL_TABLET | Freq: Every evening | ORAL | 1 refills | Status: AC | PRN

## 2024-11-26 MED ORDER — LEVOTHYROXINE SODIUM 75 MCG PO TABS
ORAL_TABLET | ORAL | 3 refills | Status: DC
  Filled 2024-11-26: qty 30, 30d supply, fill #0

## 2024-11-29 ENCOUNTER — Other Ambulatory Visit (HOSPITAL_BASED_OUTPATIENT_CLINIC_OR_DEPARTMENT_OTHER): Payer: Self-pay

## 2024-12-01 ENCOUNTER — Other Ambulatory Visit (HOSPITAL_BASED_OUTPATIENT_CLINIC_OR_DEPARTMENT_OTHER): Payer: Self-pay

## 2024-12-01 MED ORDER — CLONAZEPAM 1 MG PO TABS
1.0000 mg | ORAL_TABLET | Freq: Every day | ORAL | 1 refills | Status: AC
  Filled 2024-12-01: qty 30, 30d supply, fill #0

## 2024-12-06 ENCOUNTER — Telehealth (HOSPITAL_BASED_OUTPATIENT_CLINIC_OR_DEPARTMENT_OTHER): Payer: Self-pay

## 2024-12-06 NOTE — Telephone Encounter (Signed)
 My Chart message sent to chart

## 2024-12-06 NOTE — Telephone Encounter (Unsigned)
 Copied from CRM #[id number]. Topic: Clinical - Lab/Test Results >> Nov 29, 2024  2:55 PM [name] wrote: Reason for CRM: patient is requesting a follow up regarding CT results - please call pt back

## 2024-12-15 ENCOUNTER — Ambulatory Visit: Admitting: Pulmonary Disease

## 2024-12-15 DIAGNOSIS — R911 Solitary pulmonary nodule: Secondary | ICD-10-CM

## 2024-12-15 NOTE — Progress Notes (Deleted)
" ° °  Established Patient Pulmonology Office Visit   Subjective:  Patient ID: Loretta [name], female    DOB: 27-Oct-1966  MRN: [id number]  CC: No chief complaint on file.   HPI  ALDINA [name] is a 59 y.o. female with hx of hypothyroidism, MDD, nicotine  dependence w/ current use and Sciatica who presents for follow up.  Last seen in office 10/28/2024, recommended CT chest to evaluate said pulmonary nodule. Ordered PFTs and split night sleep study.    {PULM QUESTIONNAIRES (Optional):33196}  ROS  {History (Optional):23778} Current Medications[1]      Objective:  There were no vitals taken for this visit. {Pulm Vitals (Optional):32837}  Physical Exam   Diagnostic Review:  {Labs (Optional):32838}  CT chest 11/26/2024: IMPRESSION: 1. No concerning pulmonary nodules or masses and no acute abnormality in the chest. 2. Aortic atherosclerosis. Atherosclerotic coronary artery disease. 3. Incidental vertebral body hemangiomas.  PFT12/18/2025: borderline obstructive lung disease, hyperinflation    Assessment & Plan:   Assessment & Plan Solitary pulmonary nodule   No orders of the defined types were placed in this encounter.     No follow-ups on file.   Loretta Sweaney, MD    [1]  Current Outpatient Medications:    cariprazine (VRAYLAR) 1.5 MG capsule, Take 1 tablet by mouth daily., Disp: , Rfl:    clonazePAM  (KLONOPIN ) 1 MG tablet, Take 1 tablet by mouth at bedtime as needed., Disp: , Rfl:    clonazePAM  (KLONOPIN ) 1 MG tablet, Take 1 tablet (1 mg total) by mouth at bedtime as needed., Disp: 30 tablet, Rfl: 1   clonazePAM  (KLONOPIN ) 1 MG tablet, Take 1 tablet (1 mg total) by mouth daily., Disp: 30 tablet, Rfl: 1   levothyroxine  (SYNTHROID ) 88 MCG tablet, TAKE 1 TABLET BY MOUTH EVERY DAY BEFORE BREAKFAST, Disp: 90 tablet, Rfl: 0   lisdexamfetamine (VYVANSE) 30 MG capsule, Take 1 capsule by mouth daily., Disp: , Rfl:    methadone (DOLOPHINE) 10 MG/ML solution, Take 70  mg by mouth daily., Disp: , Rfl:    Multiple Vitamin (MULTIVITAMIN) capsule, Take 1 capsule by mouth daily., Disp: , Rfl:    ondansetron  (ZOFRAN ) 4 MG tablet, Take 1 tablet (4 mg total) by mouth every 8 (eight) hours as needed for nausea or vomiting., Disp: 30 tablet, Rfl: 1   pregabalin (LYRICA) 50 MG capsule, Take 1 mg by mouth 2 (two) times daily., Disp: , Rfl:   "

## 2024-12-30 ENCOUNTER — Other Ambulatory Visit: Payer: Self-pay | Admitting: Internal Medicine

## 2024-12-30 NOTE — Telephone Encounter (Signed)
 Last seen in February 2025, Will need an office visit for further fills.

## 2025-01-11 ENCOUNTER — Ambulatory Visit (HOSPITAL_BASED_OUTPATIENT_CLINIC_OR_DEPARTMENT_OTHER): Admitting: Pulmonary Disease

## 2025-01-12 ENCOUNTER — Telehealth: Payer: Self-pay

## 2025-01-12 ENCOUNTER — Encounter: Payer: Self-pay | Admitting: *Deleted

## 2025-01-12 ENCOUNTER — Ambulatory Visit (HOSPITAL_BASED_OUTPATIENT_CLINIC_OR_DEPARTMENT_OTHER): Admitting: Pulmonary Disease

## 2025-01-12 ENCOUNTER — Ambulatory Visit: Payer: Self-pay | Admitting: Pulmonary Disease

## 2025-01-12 NOTE — Telephone Encounter (Signed)
 Message from Valencia F sent at 01/12/2025  2:18 PM EST  Reason for Triage: Pt fell yesterday on the ice outside and is still in pain. Pt has a split night sleep study scheduled for 8pm at the [facility] at [facility] with Dr. Jude. Pt wanted medical advice if she should still go tonight or not.    Reason for Disposition  Injury (or injuries) that need emergency care  Answer Assessment - Initial Assessment Questions Advised ED now. Advised call back or ED/911 if symptoms occur/worsen: severe diff breathing, chest pain > 5 min, faint. Patient verbalized understanding.   Patient called to request if her fall or pain will interfere with sleep study and should she reschedule appt for tonight, 01/12/25?  1. MECHANISM: How did the fall happen? Slipped on ice,  3. ONSET: When did the fall happen? (e.g., minutes, hours, or days ago)    yesterday  4. LOCATION: What part of the body hit the ground? (e.g., back, buttocks, head, hips, knees, hands, head, stomach)     hit left hip, back of head; reports abd pain, HA      6. PAIN: Is there any pain? If Yes, ask: How bad is the pain? (e.g., Scale 0-10; or none, mild,      5/10 HA, headache prior to fall, left abd pain, near belly button, constant 7. SIZE: For cuts, bruises, or swelling, ask: How large is it? (e.g., inches or centimeters)      Denies bruising, lacerations, knots 9. OTHER SYMPTOMS: Do you have any other symptoms? (e.g., dizziness, fever, weakness; new-onset or worsening).      Denies weakness/ numbness, changes vision, dizziness, diff breathing, chest pain faint, fever chills n/v 10. CAUSE: What do you think caused the fall (or falling)? (e.g., dizzy spell, tripped)      Slipped on ice  Protocols used: Falls and [facility]

## 2025-01-12 NOTE — Telephone Encounter (Signed)
 If pain is severe can stay home and make sure she rests. If mild, can proceed with sleep study.

## 2025-01-12 NOTE — Telephone Encounter (Signed)
 Please advise.

## 2025-01-12 NOTE — Telephone Encounter (Unsigned)
 Copied from CRM [id number]. Topic: Clinical - Medical Advice >> Jan 12, 2025 12:37 PM Russell PARAS wrote: Reason for CRM:   Pt fell on the ice yesterday and is having mild stomach and buttock pain Wants to know if she should reschedule her split night study or proceed with study tonight.  Is worried pain may affect readings  CB#  [phone]

## 2025-01-12 NOTE — Telephone Encounter (Signed)
 [phone]  # for patient to reschedule sleep study   Patient called back and states that she was supposed to have a sleep study tonight (01/12/2025)  Patient states she has not gone to the Emergency Room---She states that she did not go and doesn't feel like she needs to  She states that nothing has changed since speaking with a Triage RN  Later in the conversation, patient decided that she will go to the ER to be further evaluated.  This RN called [facility] to advise them of patient's situation and attempting to connect patient with Powell at the clinic who had left the patient a voicemail Advised [facility] that the patient decided to go to the ER for further evaluation and needed to reschedule her sleep study  Spoke with Cranford Molt about the situation.  This RN was advised the phone for patient to be able to reschedule her sleep study. This RN called the patient and patient states she is driving at the moment and cannot write down the number while driving. She asked if she could call back to get the phone number and this RN advised her it would be in her chart from today for whoever she speaks to that can advise her of this number for her to call: [phone] to reschedule her sleep study

## 2025-01-12 NOTE — Telephone Encounter (Signed)
 Since the patient is driving and going to the ER, I sent her a mychart message with the information that she needs to reschedule her sleep study and that she will need to call them to let them know she will not be there tonight.

## 2025-01-12 NOTE — Telephone Encounter (Signed)
ATC x1.  LVM to return call. 

## 2025-02-09 ENCOUNTER — Ambulatory Visit: Admitting: "Endocrinology
